# Patient Record
Sex: Female | Born: 1955 | Race: White | Hispanic: No | Marital: Married | Smoking: Current every day smoker
Health system: Southern US, Community
[De-identification: ages and names within clinical notes are randomized; demographics above are authoritative.]

## PROBLEM LIST (undated history)

## (undated) DIAGNOSIS — R079 Chest pain, unspecified: Secondary | ICD-10-CM

## (undated) DIAGNOSIS — Z720 Tobacco use: Secondary | ICD-10-CM

## (undated) DIAGNOSIS — R609 Edema, unspecified: Secondary | ICD-10-CM

## (undated) DIAGNOSIS — I2510 Atherosclerotic heart disease of native coronary artery without angina pectoris: Secondary | ICD-10-CM

## (undated) DIAGNOSIS — R0789 Other chest pain: Secondary | ICD-10-CM

## (undated) DIAGNOSIS — R7989 Other specified abnormal findings of blood chemistry: Secondary | ICD-10-CM

## (undated) DIAGNOSIS — R200 Anesthesia of skin: Secondary | ICD-10-CM

## (undated) DIAGNOSIS — E785 Hyperlipidemia, unspecified: Secondary | ICD-10-CM

## (undated) HISTORY — DX: Hyperlipidemia, unspecified: E78.5

## (undated) HISTORY — DX: Tobacco use: Z72.0

## (undated) HISTORY — PX: KNEE SURGERY: SHX244

## (undated) HISTORY — DX: Anesthesia of skin: R20.0

## (undated) HISTORY — PX: CARPAL TUNNEL RELEASE: SHX101

## (undated) HISTORY — DX: Atherosclerotic heart disease of native coronary artery without angina pectoris: I25.10

## (undated) HISTORY — DX: Chest pain, unspecified: R07.9

## (undated) HISTORY — DX: Edema, unspecified: R60.9

## (undated) HISTORY — PX: CYST REMOVAL NECK: SHX6281

## (undated) HISTORY — DX: Other specified abnormal findings of blood chemistry: R79.89

## (undated) HISTORY — DX: Other chest pain: R07.89

---

## 1998-02-10 ENCOUNTER — Other Ambulatory Visit: Admission: RE | Admit: 1998-02-10 | Discharge: 1998-02-10 | Payer: Self-pay | Admitting: Obstetrics and Gynecology

## 1998-04-21 ENCOUNTER — Encounter: Admission: RE | Admit: 1998-04-21 | Discharge: 1998-07-20 | Payer: Self-pay | Admitting: Family Medicine

## 1999-02-11 ENCOUNTER — Other Ambulatory Visit: Admission: RE | Admit: 1999-02-11 | Discharge: 1999-02-11 | Payer: Self-pay | Admitting: Obstetrics and Gynecology

## 2000-02-13 ENCOUNTER — Other Ambulatory Visit: Admission: RE | Admit: 2000-02-13 | Discharge: 2000-02-13 | Payer: Self-pay | Admitting: Obstetrics and Gynecology

## 2001-03-06 ENCOUNTER — Other Ambulatory Visit: Admission: RE | Admit: 2001-03-06 | Discharge: 2001-03-06 | Payer: Self-pay | Admitting: Obstetrics and Gynecology

## 2002-03-17 ENCOUNTER — Other Ambulatory Visit: Admission: RE | Admit: 2002-03-17 | Discharge: 2002-03-17 | Payer: Self-pay | Admitting: Obstetrics and Gynecology

## 2003-04-20 ENCOUNTER — Other Ambulatory Visit: Admission: RE | Admit: 2003-04-20 | Discharge: 2003-04-20 | Payer: Self-pay | Admitting: Obstetrics and Gynecology

## 2004-06-09 ENCOUNTER — Other Ambulatory Visit: Admission: RE | Admit: 2004-06-09 | Discharge: 2004-06-09 | Payer: Self-pay | Admitting: Obstetrics and Gynecology

## 2005-07-26 ENCOUNTER — Other Ambulatory Visit: Admission: RE | Admit: 2005-07-26 | Discharge: 2005-07-26 | Payer: Self-pay | Admitting: Obstetrics and Gynecology

## 2007-03-26 ENCOUNTER — Encounter: Admission: RE | Admit: 2007-03-26 | Discharge: 2007-03-26 | Payer: Self-pay | Admitting: Orthopedic Surgery

## 2013-02-20 ENCOUNTER — Telehealth: Payer: Self-pay | Admitting: Family Medicine

## 2013-02-20 ENCOUNTER — Encounter: Payer: Self-pay | Admitting: Physician Assistant

## 2013-02-20 ENCOUNTER — Ambulatory Visit (INDEPENDENT_AMBULATORY_CARE_PROVIDER_SITE_OTHER): Payer: BC Managed Care – PPO | Admitting: Physician Assistant

## 2013-02-20 VITALS — BP 129/80 | HR 79 | Temp 97.9°F | Ht 63.0 in | Wt 216.0 lb

## 2013-02-20 DIAGNOSIS — H9202 Otalgia, left ear: Secondary | ICD-10-CM

## 2013-02-20 DIAGNOSIS — H9209 Otalgia, unspecified ear: Secondary | ICD-10-CM

## 2013-02-20 MED ORDER — NEOMYCIN-POLYMYXIN-HC 3.5-10000-1 OT SOLN: 3.0000 [drp] | Freq: Four times a day (QID) | OTIC | Status: DC

## 2013-02-20 MED ORDER — AMOXICILLIN-POT CLAVULANATE 875-125 MG PO TABS: 1.0000 | ORAL_TABLET | Freq: Two times a day (BID) | ORAL | Status: DC

## 2013-02-20 NOTE — Patient Instructions (Signed)
Otitis Externa Otitis externa is a bacterial or fungal infection of the outer ear canal. This is the area from the eardrum to the outside of the ear. Otitis externa is sometimes called "swimmer's ear." CAUSES  Possible causes of infection include:  Swimming in dirty water.  Moisture remaining in the ear after swimming or bathing.  Mild injury (trauma) to the ear.  Objects stuck in the ear (foreign body).  Cuts or scrapes (abrasions) on the outside of the ear. SYMPTOMS  The first symptom of infection is often itching in the ear canal. Later signs and symptoms may include swelling and redness of the ear canal, ear pain, and yellowish-white fluid (pus) coming from the ear. The ear pain may be worse when pulling on the earlobe. DIAGNOSIS  Your caregiver will perform a physical exam. A sample of fluid may be taken from the ear and examined for bacteria or fungi. TREATMENT  Antibiotic ear drops are often given for 10 to 14 days. Treatment may also include pain medicine or corticosteroids to reduce itching and swelling. PREVENTION   Keep your ear dry. Use the corner of a towel to absorb water out of the ear canal after swimming or bathing.  Avoid scratching or putting objects inside your ear. This can damage the ear canal or remove the protective wax that lines the canal. This makes it easier for bacteria and fungi to grow.  Avoid swimming in lakes, polluted water, or poorly chlorinated pools.  You may use ear drops made of rubbing alcohol and vinegar after swimming. Combine equal parts of white vinegar and alcohol in a bottle. Put 3 or 4 drops into each ear after swimming. HOME CARE INSTRUCTIONS   Apply antibiotic ear drops to the ear canal as prescribed by your caregiver.  Only take over-the-counter or prescription medicines for pain, discomfort, or fever as directed by your caregiver.  If you have diabetes, follow any additional treatment instructions from your caregiver.  Keep all  follow-up appointments as directed by your caregiver. SEEK MEDICAL CARE IF:   You have a fever.  Your ear is still red, swollen, painful, or draining pus after 3 days.  Your redness, swelling, or pain gets worse.  You have a severe headache.  You have redness, swelling, pain, or tenderness in the area behind your ear. MAKE SURE YOU:   Understand these instructions.  Will watch your condition.  Will get help right away if you are not doing well or get worse. Document Released: 07/24/2005 Document Revised: 10/16/2011 Document Reviewed: 08/10/2011 Exit[employer] Patient Information 2014 [employer].  

## 2013-02-20 NOTE — Progress Notes (Signed)
Subjective:     Patient ID: Miranda Little, female   DOB: 05-Aug-1956, 57 y.o.   MRN: [id number]  HPI Pt with L ear pain Hx of otitis externa and otitis media requiring referral last yr Pain and decrease in hearing to the L ear No fever/chills No drainage from the ear  Review of Systems  All other systems reviewed and are negative.       Objective:   Physical Exam NAD L canal with erythema and edema + Fluid behind TM with loss of landmarks R ear- canal and TM no Oral- no lesions No cerv nodes    Assessment:     Otitis media/externa    Plan:     Augmentin bid x 10 days Cortisporin Otic Swimmer ears drops after swimming in future F/U prn

## 2013-02-20 NOTE — Telephone Encounter (Signed)
appt today with Miranda Little

## 2013-03-04 ENCOUNTER — Telehealth: Payer: Self-pay | Admitting: Physician Assistant

## 2013-03-04 NOTE — Telephone Encounter (Signed)
OK Diflucan 150 #1

## 2013-03-05 NOTE — Telephone Encounter (Signed)
Called in to [facility] 

## 2013-03-13 ENCOUNTER — Other Ambulatory Visit: Payer: Self-pay | Admitting: Nurse Practitioner

## 2013-03-13 ENCOUNTER — Telehealth: Payer: Self-pay | Admitting: Physician Assistant

## 2013-03-13 NOTE — Telephone Encounter (Signed)
Patient seen in office on 7-17 by Montey Hora for an ear infection. Was given an antibiotic now requesting med for yeast infection. Please advise

## 2013-03-17 MED ORDER — FLUCONAZOLE 150 MG PO TABS: 150.0000 mg | ORAL_TABLET | Freq: Once | ORAL | Status: DC

## 2013-03-17 NOTE — Telephone Encounter (Signed)
Diflucan rx sent to [facility]

## 2013-03-17 NOTE — Telephone Encounter (Signed)
Left details on vm. 

## 2013-06-04 ENCOUNTER — Encounter: Payer: Self-pay | Admitting: Family Medicine

## 2013-06-04 ENCOUNTER — Ambulatory Visit (INDEPENDENT_AMBULATORY_CARE_PROVIDER_SITE_OTHER): Payer: BC Managed Care – PPO | Admitting: Family Medicine

## 2013-06-04 VITALS — BP 130/79 | HR 87 | Temp 98.4°F | Wt 218.0 lb

## 2013-06-04 DIAGNOSIS — H9209 Otalgia, unspecified ear: Secondary | ICD-10-CM

## 2013-06-04 DIAGNOSIS — H9202 Otalgia, left ear: Secondary | ICD-10-CM

## 2013-06-04 DIAGNOSIS — J329 Chronic sinusitis, unspecified: Secondary | ICD-10-CM

## 2013-06-04 MED ORDER — AMOXICILLIN-POT CLAVULANATE 875-125 MG PO TABS: 1.0000 | ORAL_TABLET | Freq: Two times a day (BID) | ORAL | Status: DC

## 2013-06-04 NOTE — Progress Notes (Signed)
  Subjective:    Patient ID: Miranda Little, female    DOB: 1956-03-12, 57 y.o.   MRN: [id number]  HPI This 57 y.o. female presents for evaluation of right eye redness and swelling and URI  Sx's for over 2 weeks.     Review of Systems   No chest pain, SOB, HA, dizziness, vision change, N/V, diarrhea, constipation, dysuria, urinary urgency or frequency, myalgias, arthralgias or rash.  Objective:   Physical Exam  Vital signs noted  Well developed well nourished female.  HEENT - Head atraumatic Normocephalic                Eyes - PERRLA, Conjuctiva - OD injected bottom conjunctival sac with exudate.                OD conjunctiva normal. Sclera- Clear EOMI                Ears - EAC's Wnl TM's Wnl Gross Hearing WNL                Nose - Nares boggy and decreased patency                Throat - oropharanx wnl Respiratory - Lungs CTA bilateral Cardiac - RRR S1 and S2 without murmur GI - Abdomen soft Nontender and bowel sounds active x 4 Extremities - No edema. Neuro - Grossly intact.      Assessment & Plan:   Sinusitis - Plan: amoxicillin-clavulanate (AUGMENTIN) 875-125 MG per tablet po bid x 10 days  Conjunctivitis OD - Continue Tobra eye gtt's Qid x 7days.  Deatra Canter FNP

## 2013-06-04 NOTE — Patient Instructions (Signed)

## 2014-02-26 ENCOUNTER — Telehealth: Payer: Self-pay | Admitting: Family Medicine

## 2015-03-17 ENCOUNTER — Inpatient Hospital Stay (HOSPITAL_COMMUNITY)
Admission: EM | Admit: 2015-03-17 | Discharge: 2015-03-20 | DRG: 419 | Disposition: A | Discharge status: Home / Self Care | Payer: BC Managed Care – PPO | Attending: Surgery | Admitting: Surgery

## 2015-03-17 ENCOUNTER — Encounter (HOSPITAL_COMMUNITY): Payer: Self-pay | Admitting: Emergency Medicine

## 2015-03-17 ENCOUNTER — Emergency Department (HOSPITAL_COMMUNITY): Payer: BC Managed Care – PPO

## 2015-03-17 DIAGNOSIS — K8067 Calculus of gallbladder and bile duct with acute and chronic cholecystitis with obstruction: Principal | ICD-10-CM | POA: Diagnosis present

## 2015-03-17 DIAGNOSIS — F419 Anxiety disorder, unspecified: Secondary | ICD-10-CM | POA: Diagnosis present

## 2015-03-17 DIAGNOSIS — K819 Cholecystitis, unspecified: Secondary | ICD-10-CM | POA: Diagnosis present

## 2015-03-17 DIAGNOSIS — Z419 Encounter for procedure for purposes other than remedying health state, unspecified: Secondary | ICD-10-CM

## 2015-03-17 DIAGNOSIS — Z01818 Encounter for other preprocedural examination: Secondary | ICD-10-CM

## 2015-03-17 DIAGNOSIS — K8050 Calculus of bile duct without cholangitis or cholecystitis without obstruction: Secondary | ICD-10-CM

## 2015-03-17 DIAGNOSIS — F17210 Nicotine dependence, cigarettes, uncomplicated: Secondary | ICD-10-CM | POA: Diagnosis present

## 2015-03-17 DIAGNOSIS — R1011 Right upper quadrant pain: Secondary | ICD-10-CM | POA: Diagnosis present

## 2015-03-17 LAB — SURGICAL PCR SCREEN
MRSA, PCR: NEGATIVE
Staphylococcus aureus: NEGATIVE

## 2015-03-17 LAB — COMPREHENSIVE METABOLIC PANEL
ALK PHOS: 122 U/L (ref 38–126)
ALT: 430 U/L — ABNORMAL HIGH (ref 14–54)
ANION GAP: 8 (ref 5–15)
AST: 491 U/L — ABNORMAL HIGH (ref 15–41)
Albumin: 3.9 g/dL (ref 3.5–5.0)
BILIRUBIN TOTAL: 1.4 mg/dL — AB (ref 0.3–1.2)
BUN: 7 mg/dL (ref 6–20)
CHLORIDE: 105 mmol/L (ref 101–111)
CO2: 24 mmol/L (ref 22–32)
CREATININE: 0.75 mg/dL (ref 0.44–1.00)
Calcium: 9.4 mg/dL (ref 8.9–10.3)
GLUCOSE: 112 mg/dL — AB (ref 65–99)
POTASSIUM: 4.3 mmol/L (ref 3.5–5.1)
Sodium: 137 mmol/L (ref 135–145)
Total Protein: 7.2 g/dL (ref 6.5–8.1)

## 2015-03-17 LAB — URINALYSIS, ROUTINE W REFLEX MICROSCOPIC
GLUCOSE, UA: NEGATIVE mg/dL
HGB URINE DIPSTICK: NEGATIVE
KETONES UR: NEGATIVE mg/dL
LEUKOCYTES UA: NEGATIVE
Nitrite: NEGATIVE
PROTEIN: NEGATIVE mg/dL
Specific Gravity, Urine: 1.021 (ref 1.005–1.030)
UROBILINOGEN UA: 1 mg/dL (ref 0.0–1.0)
pH: 6 (ref 5.0–8.0)

## 2015-03-17 LAB — CBC WITH DIFFERENTIAL/PLATELET
BASOS ABS: 0 10*3/uL (ref 0.0–0.1)
Basophils Relative: 1 % (ref 0–1)
Eosinophils Absolute: 0 10*3/uL (ref 0.0–0.7)
Eosinophils Relative: 1 % (ref 0–5)
HEMATOCRIT: 45 % (ref 36.0–46.0)
Hemoglobin: 15.3 g/dL — ABNORMAL HIGH (ref 12.0–15.0)
LYMPHS ABS: 1 10*3/uL (ref 0.7–4.0)
LYMPHS PCT: 12 % (ref 12–46)
MCH: 29 pg (ref 26.0–34.0)
MCHC: 34 g/dL (ref 30.0–36.0)
MCV: 85.2 fL (ref 78.0–100.0)
Monocytes Absolute: 0.4 10*3/uL (ref 0.1–1.0)
Monocytes Relative: 6 % (ref 3–12)
Neutro Abs: 6.4 10*3/uL (ref 1.7–7.7)
Neutrophils Relative %: 80 % — ABNORMAL HIGH (ref 43–77)
Platelets: 267 10*3/uL (ref 150–400)
RBC: 5.28 MIL/uL — AB (ref 3.87–5.11)
RDW: 13.3 % (ref 11.5–15.5)
WBC: 7.9 10*3/uL (ref 4.0–10.5)

## 2015-03-17 LAB — LIPASE, BLOOD: LIPASE: 22 U/L (ref 22–51)

## 2015-03-17 MED ORDER — DEXTROSE 5 % IV SOLN
2.0000 g | INTRAVENOUS | Status: DC
  Administered 2015-03-18 – 2015-03-19 (×2): 2 g via INTRAVENOUS
  Filled 2015-03-17 (×3): qty 2

## 2015-03-17 MED ORDER — KCL IN DEXTROSE-NACL 20-5-0.45 MEQ/L-%-% IV SOLN
INTRAVENOUS | Status: DC
  Administered 2015-03-17: 17:00:00 via INTRAVENOUS
  Filled 2015-03-17 (×6): qty 1000

## 2015-03-17 MED ORDER — ACETAMINOPHEN 325 MG PO TABS: 650.0000 mg | ORAL_TABLET | Freq: Four times a day (QID) | ORAL | Status: DC | PRN

## 2015-03-17 MED ORDER — DIPHENHYDRAMINE HCL 12.5 MG/5ML PO ELIX: 12.5000 mg | ORAL_SOLUTION | Freq: Four times a day (QID) | ORAL | Status: DC | PRN

## 2015-03-17 MED ORDER — SIMETHICONE 80 MG PO CHEW
40.0000 mg | CHEWABLE_TABLET | Freq: Four times a day (QID) | ORAL | Status: DC | PRN
  Filled 2015-03-17: qty 1

## 2015-03-17 MED ORDER — MORPHINE SULFATE 2 MG/ML IJ SOLN: 1.0000 mg | INTRAMUSCULAR | Status: DC | PRN

## 2015-03-17 MED ORDER — ONDANSETRON 4 MG PO TBDP
4.0000 mg | ORAL_TABLET | Freq: Four times a day (QID) | ORAL | Status: DC | PRN
  Filled 2015-03-17: qty 1

## 2015-03-17 MED ORDER — LORAZEPAM 2 MG/ML IJ SOLN: 0.5000 mg | Freq: Four times a day (QID) | INTRAMUSCULAR | Status: DC | PRN

## 2015-03-17 MED ORDER — ONDANSETRON HCL 4 MG/2ML IJ SOLN
4.0000 mg | Freq: Four times a day (QID) | INTRAMUSCULAR | Status: DC | PRN
  Administered 2015-03-18: 4 mg via INTRAVENOUS

## 2015-03-17 MED ORDER — ACETAMINOPHEN 650 MG RE SUPP: 650.0000 mg | Freq: Four times a day (QID) | RECTAL | Status: DC | PRN

## 2015-03-17 MED ORDER — DIPHENHYDRAMINE HCL 50 MG/ML IJ SOLN: 12.5000 mg | Freq: Four times a day (QID) | INTRAMUSCULAR | Status: DC | PRN

## 2015-03-17 MED ORDER — DEXTROSE 5 % IV SOLN
2.0000 g | INTRAVENOUS | Status: DC
  Administered 2015-03-17: 2 g via INTRAVENOUS
  Filled 2015-03-17: qty 2

## 2015-03-17 NOTE — ED Notes (Signed)
MD at bedside. 

## 2015-03-17 NOTE — ED Notes (Signed)
Unable to obtain blood work in triage due to pt stating that "every time she gets blood drawn she passes out"

## 2015-03-17 NOTE — ED Provider Notes (Signed)
CSN: [id number]     Arrival date & time 03/17/15  4098 History   First MD Initiated Contact with Patient 03/17/15 [phone]     Chief Complaint  Patient presents with  . Abdominal Pain  . Back Pain     (Consider location/radiation/quality/duration/timing/severity/associated sxs/prior Treatment) Patient is a 59 y.o. female presenting with abdominal pain and back pain. The history is provided by the patient.  Abdominal Pain Associated symptoms: nausea   Associated symptoms: no chest pain, no diarrhea, no shortness of breath and no vomiting   Back Pain Associated symptoms: abdominal pain   Associated symptoms: no chest pain, no headaches, no numbness and no weakness   patient resides with abdominal pain. Started around 7:00 last night. She's had nausea without vomiting. She has had episodes in the past of similar pain with nausea and vomiting and the pain resolved after vomiting. States she has no fevers. Upon arrival to the ER she feels somewhat better.no diarrhea. No constipation.she's had previous colonoscopies and EGD.  History reviewed. No pertinent past medical history. Past Surgical History  Procedure Laterality Date  . Knee surgery Right   . Cyst removal neck    . Carpal tunnel release    . Cesarean section     Family History  Problem Relation Age of Onset  . Tuberculosis Mother   . Cancer Father     colon  . Stroke Father   . Hypertension Sister    Social History  Substance Use Topics  . Smoking status: Current Every Day Smoker -- 0.50 packs/day for 35 years    Types: Cigarettes  . Smokeless tobacco: Never Used  . Alcohol Use: Yes     Comment: occasionally   OB History    No data available     Review of Systems  Constitutional: Positive for appetite change. Negative for activity change.  Eyes: Negative for pain.  Respiratory: Negative for chest tightness and shortness of breath.   Cardiovascular: Negative for chest pain and leg swelling.  Gastrointestinal:  Positive for nausea and abdominal pain. Negative for vomiting and diarrhea.  Genitourinary: Negative for flank pain.  Musculoskeletal: Positive for back pain. Negative for neck stiffness.  Skin: Negative for rash.  Neurological: Negative for weakness, numbness and headaches.  Psychiatric/Behavioral: Negative for behavioral problems.      Allergies  Darvocet and Sulfa antibiotics  Home Medications   Prior to Admission medications   Medication Sig Start Date End Date Taking? Authorizing Provider  acetaminophen (TYLENOL) 325 MG tablet Take 650 mg by mouth every 6 (six) hours as needed for mild pain.   Yes Historical Provider, MD  ALPRAZolam Prudy Feeler) 1 MG tablet Take 1 mg by mouth at bedtime as needed for anxiety.   Yes Historical Provider, MD  ibuprofen (ADVIL,MOTRIN) 200 MG tablet Take 200 mg by mouth every 6 (six) hours as needed.   Yes Historical Provider, MD   BP 153/75 mmHg  Pulse 94  Temp(Src) 98.2 F (36.8 C) (Oral)  Resp 12  Ht 5\' 3"  (1.6 m)  Wt 215 lb (97.523 kg)  BMI 38.09 kg/m2  SpO2 99% Physical Exam  Constitutional: She appears well-developed.  Patient is somewhat overweight  HENT:  Head: Atraumatic.  Cardiovascular: Normal rate.   Pulmonary/Chest: Effort normal.  Abdominal: Soft. There is tenderness.  Very mild right upper quadrant tenderness without rebound or guarding.  Musculoskeletal: Normal range of motion.  Neurological: She is alert.  Skin: Skin is warm.    ED Course  Procedures (including  critical care time) Labs Review Labs Reviewed  URINALYSIS, ROUTINE W REFLEX MICROSCOPIC (NOT AT [facility]) - Abnormal; Notable for the following:    Color, Urine AMBER (*)    APPearance CLOUDY (*)    Bilirubin Urine SMALL (*)    All other components within normal limits  COMPREHENSIVE METABOLIC PANEL - Abnormal; Notable for the following:    Glucose, Bld 112 (*)    AST 491 (*)    ALT 430 (*)    Total Bilirubin 1.4 (*)    All other components within normal limits   CBC WITH DIFFERENTIAL/PLATELET - Abnormal; Notable for the following:    RBC 5.28 (*)    Hemoglobin 15.3 (*)    Neutrophils Relative % 80 (*)    All other components within normal limits  COMPREHENSIVE METABOLIC PANEL - Abnormal; Notable for the following:    Glucose, Bld 106 (*)    AST 214 (*)    ALT 354 (*)    Alkaline Phosphatase 130 (*)    All other components within normal limits  SURGICAL PCR SCREEN  LIPASE, BLOOD  CBC  SURGICAL PATHOLOGY    Imaging Review Dg Cholangiogram Operative  03/18/2015   CLINICAL DATA:  Cholelithiasis  EXAM: INTRAOPERATIVE CHOLANGIOGRAM  TECHNIQUE: Cholangiographic images from the C-arm fluoroscopic device were submitted for interpretation post-operatively. Please see the procedural report for the amount of contrast and the fluoroscopy time utilized.  COMPARISON:  03/17/2015  FLUOROSCOPY TIME:  18 seconds  FINDINGS: Injection in the cystic duct remnant reveals opacification of the intrahepatic and extrahepatic biliary tree. A a rounded filling defect is noted in the distal common bile duct with only minimal passage of contrast material into the duodenum.  IMPRESSION: Distal common bile duct stone.   Electronically Signed   By: Alcide Clever M.D.   On: 03/18/2015 08:22   US Abdomen Limited  03/17/2015   CLINICAL DATA:  Right upper quadrant pain  EXAM: US ABDOMEN LIMITED - RIGHT UPPER QUADRANT  COMPARISON:  None.  FINDINGS: Gallbladder:  Gallstones are noted within gallbladder the largest measures 3.6 cm. 1.5 cm gallstone is noted in gallbladder neck region. There is thickening of gallbladder wall up to 4.3 mm. No sonographic Murphy's sign.  Common bile duct:  Diameter: 4.4 mm in diameter within normal limits.  Liver:  No focal lesion identified. Diffuse increased echogenicity of the liver suspicious for fatty infiltration.  IMPRESSION: Gallstones are noted within gallbladder the largest measures 3.6 cm. No sonographic Murphy's sign. Thickening of gallbladder wall  up to 4.3 mm. Normal CBD. Fatty infiltration of the liver.   Electronically Signed   By: Natasha Mead M.D.   On: 03/17/2015 10:40   Dg Chest Port 1 View  03/18/2015   CLINICAL DATA:  Gallbladder surgery.  EXAM: PORTABLE CHEST - 1 VIEW  COMPARISON:  None.  FINDINGS: Mediastinum and hilar structures are normal. Cardiomegaly. No pulmonary venous congestion. Lungs are clear. No pleural effusion or pneumothorax. Degenerative changes thoracic spine.  IMPRESSION: Mild cardiomegaly. No pulmonary venous congestion. No acute pulmonary disease.   Electronically Signed   By: Maisie Fus  Register   On: 03/18/2015 07:28     EKG Interpretation   Date/Time:  Wednesday March 17 2015 15:31:29 EDT Ventricular Rate:  79 PR Interval:  179 QRS Duration: 108 QT Interval:  384 QTC Calculation: 440 R Axis:   -164 Text Interpretation:  Sinus rhythm Consider left atrial enlargement Low  voltage with right axis deviation Consider anterior infarct ED PHYSICIAN  INTERPRETATION  AVAILABLE IN [facility] Confirmed by TEST, Record  ([id number]) on 03/18/2015 7:11:24 AM      MDM   Final diagnoses:  Biliary colic  Pre-op evaluation    Patient with biliary colic. Does have some gallbladder wall thickening and large stones at the neck with mildly elevated LFTs. Seen by general surgery and will admit for cholecystectomy.    Benjiman Core, MD 03/18/15 [phone]

## 2015-03-17 NOTE — ED Notes (Signed)
P. Stated, I started having abdominal pain and bak pain last night and its so bad. I called Dr. Ewing Schlein and he said come here.

## 2015-03-17 NOTE — ED Notes (Signed)
Pt requesting no IV access unless needed at this time.

## 2015-03-17 NOTE — ED Notes (Signed)
Patient transported to Ultrasound 

## 2015-03-17 NOTE — ED Notes (Signed)
Obtained consent. 

## 2015-03-17 NOTE — H&P (Signed)
Miranda Little 23-Apr-1956  [id number].   Primary Care MD: Dr. Redge Gainer Chief Complaint/Reason for Consult: biliary colic HPI: This is a very pleasant 59 yo white female who began having abdominal pain intermittently that would hurt in her upper abdomen through to her shoulders.  This has happened on 3 separate occasions before last night.  She had a colonoscopy by Dr. Watt Climes and mentioned this to him.  He told her to call if it happened again, as she may be having biliary colic.  She ate a whopper last night and around 1900, she developed periumbilical and diffuse abdominal pain.  This was the most severe and lasted the longest of any episode.  She had horrible nausea, but did not throw up, like she did the other times.  She states that she occasionally has some discomfort after eating as well.  She presented to [facility] where she had an [place], which revealed several large stones and some gallbladder wall thickening.  Her LFTs were elevated.  The patient's pain has resolved, but we will still admit her for cholecystectomy.  ROS : Please see HPI, otherwise all other systems are currently negative except for anxiety.  Family History  Problem Relation Age of Onset  . Tuberculosis Mother   . Cancer Father     colon  . Stroke Father   . Hypertension Sister     History reviewed. No pertinent past medical history.  Past Surgical History  Procedure Laterality Date  . Knee surgery Right   . Cyst removal neck    . Carpal tunnel release    . Cesarean section      Social History:  reports that she has been smoking Cigarettes.  She has a 17.5 pack-year smoking history. She has never used smokeless tobacco. She reports that she drinks alcohol. She reports that she does not use illicit drugs.  Allergies:  Allergies  Allergen Reactions  . Darvocet [Propoxyphene N-Acetaminophen] Hives  . Sulfa Antibiotics      (Not in a hospital admission)  Blood pressure 136/79, pulse 85, temperature 97.8 F  (36.6 C), temperature source Oral, resp. rate 20, SpO2 95 %. Physical Exam: General: pleasant, obese white female who is laying in bed in NAD HEENT: head is normocephalic, atraumatic.  Sclera are noninjected.  PERRL.  Ears and nose without any masses or lesions.  Mouth is pink and moist Heart: regular, rate, and rhythm.  Normal s1,s2. No obvious murmurs, gallops, or rubs noted.  Palpable radial and pedal pulses bilaterally Lungs: CTAB, no wheezes, rhonchi, or rales noted.  Respiratory effort nonlabored Abd: soft, NT, ND, +BS, no masses, hernias, or organomegaly, obese MS: all 4 extremities are symmetrical with no cyanosis, clubbing, or edema. Skin: warm and dry with no masses, lesions, or rashes Psych: A&Ox3 with an appropriate affect.    Results for orders placed or performed during the hospital encounter of 03/17/15 (from the past 48 hour(s))  Urinalysis, Routine w reflex microscopic (not at [facility])     Status: Abnormal   Collection Time: 03/17/15  9:07 AM  Result Value Ref Range   Color, Urine AMBER (A) YELLOW    Comment: BIOCHEMICALS MAY BE AFFECTED BY COLOR   APPearance CLOUDY (A) CLEAR   Specific Gravity, Urine 1.021 1.005 - 1.030   pH 6.0 5.0 - 8.0   Glucose, UA NEGATIVE NEGATIVE mg/dL   Hgb urine dipstick NEGATIVE NEGATIVE   Bilirubin Urine SMALL (A) NEGATIVE   Ketones, ur NEGATIVE NEGATIVE mg/dL   Protein,  ur NEGATIVE NEGATIVE mg/dL   Urobilinogen, UA 1.0 0.0 - 1.0 mg/dL   Nitrite NEGATIVE NEGATIVE   Leukocytes, UA NEGATIVE NEGATIVE    Comment: MICROSCOPIC NOT DONE ON URINES WITH NEGATIVE PROTEIN, BLOOD, LEUKOCYTES, NITRITE, OR GLUCOSE <1000 mg/dL.  Comprehensive metabolic panel     Status: Abnormal   Collection Time: 03/17/15 11:00 AM  Result Value Ref Range   Sodium 137 135 - 145 mmol/L   Potassium 4.3 3.5 - 5.1 mmol/L   Chloride 105 101 - 111 mmol/L   CO2 24 22 - 32 mmol/L   Glucose, Bld 112 (H) 65 - 99 mg/dL   BUN 7 6 - 20 mg/dL   Creatinine, Ser 0.75 0.44 - 1.00  mg/dL   Calcium 9.4 8.9 - 10.3 mg/dL   Total Protein 7.2 6.5 - 8.1 g/dL   Albumin 3.9 3.5 - 5.0 g/dL   AST 491 (H) 15 - 41 U/L   ALT 430 (H) 14 - 54 U/L   Alkaline Phosphatase 122 38 - 126 U/L   Total Bilirubin 1.4 (H) 0.3 - 1.2 mg/dL   GFR calc non Af Amer >60 >60 mL/min   GFR calc Af Amer >60 >60 mL/min    Comment: (NOTE) The eGFR has been calculated using the CKD EPI equation. This calculation has not been validated in all clinical situations. eGFR's persistently <60 mL/min signify possible Chronic Kidney Disease.    Anion gap 8 5 - 15  Lipase, blood     Status: None   Collection Time: 03/17/15 11:00 AM  Result Value Ref Range   Lipase 22 22 - 51 U/L  CBC with Differential     Status: Abnormal   Collection Time: 03/17/15 11:00 AM  Result Value Ref Range   WBC 7.9 4.0 - 10.5 K/uL   RBC 5.28 (H) 3.87 - 5.11 MIL/uL   Hemoglobin 15.3 (H) 12.0 - 15.0 g/dL   HCT 45.0 36.0 - 46.0 %   MCV 85.2 78.0 - 100.0 fL   MCH 29.0 26.0 - 34.0 pg   MCHC 34.0 30.0 - 36.0 g/dL   RDW 13.3 11.5 - 15.5 %   Platelets 267 150 - 400 K/uL   Neutrophils Relative % 80 (H) 43 - 77 %   Neutro Abs 6.4 1.7 - 7.7 K/uL   Lymphocytes Relative 12 12 - 46 %   Lymphs Abs 1.0 0.7 - 4.0 K/uL   Monocytes Relative 6 3 - 12 %   Monocytes Absolute 0.4 0.1 - 1.0 K/uL   Eosinophils Relative 1 0 - 5 %   Eosinophils Absolute 0.0 0.0 - 0.7 K/uL   Basophils Relative 1 0 - 1 %   Basophils Absolute 0.0 0.0 - 0.1 K/uL   US Abdomen Limited  03/17/2015   CLINICAL DATA:  Right upper quadrant pain  EXAM: US ABDOMEN LIMITED - RIGHT UPPER QUADRANT  COMPARISON:  None.  FINDINGS: Gallbladder:  Gallstones are noted within gallbladder the largest measures 3.6 cm. 1.5 cm gallstone is noted in gallbladder neck region. There is thickening of gallbladder wall up to 4.3 mm. No sonographic Murphy's sign.  Common bile duct:  Diameter: 4.4 mm in diameter within normal limits.  Liver:  No focal lesion identified. Diffuse increased  echogenicity of the liver suspicious for fatty infiltration.  IMPRESSION: Gallstones are noted within gallbladder the largest measures 3.6 cm. No sonographic Murphy's sign. Thickening of gallbladder wall up to 4.3 mm. Normal CBD. Fatty infiltration of the liver.   Electronically Signed  By: Lahoma Crocker M.D.   On: 03/17/2015 10:40       Assessment/Plan 1. Biliary colic, early cholecystitis -admit, IVF, IV rocephin for possible early cholecystitis -plan for OR tomorrow for lap chole.  The procedure, risks, complications, and expected outcome have all been d/w the patient and her husband.  She is agreeable to proceed. -pre op EKG and CXR (smokes 0.5ppd) -IV prn ativan for anxiety -clear liquids today, NPO p MN for OR tomorrow at 0730.  Ghalia Reicks E 03/17/2015, 12:22 PM Pager: [phone]

## 2015-03-18 ENCOUNTER — Inpatient Hospital Stay (HOSPITAL_COMMUNITY): Payer: BC Managed Care – PPO | Admitting: Anesthesiology

## 2015-03-18 ENCOUNTER — Inpatient Hospital Stay (HOSPITAL_COMMUNITY): Payer: BC Managed Care – PPO

## 2015-03-18 ENCOUNTER — Encounter (HOSPITAL_COMMUNITY): Payer: Self-pay | Admitting: Anesthesiology

## 2015-03-18 ENCOUNTER — Encounter (HOSPITAL_COMMUNITY): Admission: EM | Disposition: A | Discharge status: Home / Self Care | Payer: Self-pay | Source: Home / Self Care

## 2015-03-18 HISTORY — PX: CHOLECYSTECTOMY: SHX55

## 2015-03-18 LAB — COMPREHENSIVE METABOLIC PANEL
ALT: 354 U/L — ABNORMAL HIGH (ref 14–54)
ANION GAP: 8 (ref 5–15)
AST: 214 U/L — ABNORMAL HIGH (ref 15–41)
Albumin: 3.6 g/dL (ref 3.5–5.0)
Alkaline Phosphatase: 130 U/L — ABNORMAL HIGH (ref 38–126)
BUN: 7 mg/dL (ref 6–20)
CALCIUM: 9.1 mg/dL (ref 8.9–10.3)
CO2: 27 mmol/L (ref 22–32)
CREATININE: 0.8 mg/dL (ref 0.44–1.00)
Chloride: 104 mmol/L (ref 101–111)
GFR calc Af Amer: >60 mL/min (ref >60)
GLUCOSE: 106 mg/dL — AB (ref 65–99)
Potassium: 4.2 mmol/L (ref 3.5–5.1)
Sodium: 139 mmol/L (ref 135–145)
Total Bilirubin: 1 mg/dL (ref 0.3–1.2)
Total Protein: 6.9 g/dL (ref 6.5–8.1)

## 2015-03-18 LAB — CBC
HCT: 43.3 % (ref 36.0–46.0)
Hemoglobin: 14.4 g/dL (ref 12.0–15.0)
MCH: 28.6 pg (ref 26.0–34.0)
MCHC: 33.3 g/dL (ref 30.0–36.0)
MCV: 86.1 fL (ref 78.0–100.0)
PLATELETS: 273 10*3/uL (ref 150–400)
RBC: 5.03 MIL/uL (ref 3.87–5.11)
RDW: 13.6 % (ref 11.5–15.5)
WBC: 7.5 10*3/uL (ref 4.0–10.5)

## 2015-03-18 SURGERY — LAPAROSCOPIC CHOLECYSTECTOMY WITH INTRAOPERATIVE CHOLANGIOGRAM
Anesthesia: General

## 2015-03-18 MED ORDER — FENTANYL CITRATE (PF) 250 MCG/5ML IJ SOLN
INTRAMUSCULAR | Status: AC
  Filled 2015-03-18: qty 5

## 2015-03-18 MED ORDER — MIDAZOLAM HCL 5 MG/5ML IJ SOLN
INTRAMUSCULAR | Status: DC | PRN
  Administered 2015-03-18: 2 mg via INTRAVENOUS

## 2015-03-18 MED ORDER — PROPOFOL 10 MG/ML IV BOLUS
INTRAVENOUS | Status: AC
  Filled 2015-03-18: qty 20

## 2015-03-18 MED ORDER — PHENYLEPHRINE HCL 10 MG/ML IJ SOLN
INTRAMUSCULAR | Status: DC | PRN
  Administered 2015-03-18: 120 ug via INTRAVENOUS
  Administered 2015-03-18: 80 ug via INTRAVENOUS

## 2015-03-18 MED ORDER — HYDROMORPHONE HCL 1 MG/ML IJ SOLN
0.2500 mg | INTRAMUSCULAR | Status: DC | PRN
  Administered 2015-03-18 (×2): 0.5 mg via INTRAVENOUS

## 2015-03-18 MED ORDER — BUPIVACAINE-EPINEPHRINE 0.25% -1:200000 IJ SOLN
INTRAMUSCULAR | Status: DC | PRN
  Administered 2015-03-18: 30 mL

## 2015-03-18 MED ORDER — NEOSTIGMINE METHYLSULFATE 10 MG/10ML IV SOLN
INTRAVENOUS | Status: DC | PRN
  Administered 2015-03-18: 3 mg via INTRAVENOUS

## 2015-03-18 MED ORDER — SODIUM CHLORIDE 0.9 % IJ SOLN
INTRAMUSCULAR | Status: AC
  Filled 2015-03-18: qty 10

## 2015-03-18 MED ORDER — 0.9 % SODIUM CHLORIDE (POUR BTL) OPTIME
TOPICAL | Status: DC | PRN
  Administered 2015-03-18: 1000 mL

## 2015-03-18 MED ORDER — BUPIVACAINE-EPINEPHRINE (PF) 0.25% -1:200000 IJ SOLN
INTRAMUSCULAR | Status: AC
  Filled 2015-03-18: qty 30

## 2015-03-18 MED ORDER — SODIUM CHLORIDE 0.9 % IR SOLN
Status: DC | PRN
  Administered 2015-03-18: 1000 mL

## 2015-03-18 MED ORDER — ONDANSETRON HCL 4 MG/2ML IJ SOLN
INTRAMUSCULAR | Status: AC
  Filled 2015-03-18: qty 2

## 2015-03-18 MED ORDER — MIDAZOLAM HCL 2 MG/2ML IJ SOLN
INTRAMUSCULAR | Status: AC
  Filled 2015-03-18: qty 4

## 2015-03-18 MED ORDER — SODIUM CHLORIDE 0.9 % IV SOLN
INTRAVENOUS | Status: DC | PRN
  Administered 2015-03-18: 08:00:00

## 2015-03-18 MED ORDER — EPHEDRINE SULFATE 50 MG/ML IJ SOLN
INTRAMUSCULAR | Status: AC
  Filled 2015-03-18: qty 1

## 2015-03-18 MED ORDER — PROPOFOL 10 MG/ML IV BOLUS
INTRAVENOUS | Status: DC | PRN
  Administered 2015-03-18: 200 mg via INTRAVENOUS

## 2015-03-18 MED ORDER — EPHEDRINE SULFATE 50 MG/ML IJ SOLN
INTRAMUSCULAR | Status: DC | PRN
  Administered 2015-03-18: 10 mg via INTRAVENOUS

## 2015-03-18 MED ORDER — GLYCOPYRROLATE 0.2 MG/ML IJ SOLN
INTRAMUSCULAR | Status: AC
  Filled 2015-03-18: qty 2

## 2015-03-18 MED ORDER — LACTATED RINGERS IV SOLN
INTRAVENOUS | Status: DC | PRN
  Administered 2015-03-18: 07:00:00 via INTRAVENOUS

## 2015-03-18 MED ORDER — OXYCODONE-ACETAMINOPHEN 5-325 MG PO TABS
1.0000 | ORAL_TABLET | ORAL | Status: DC | PRN
  Administered 2015-03-18 – 2015-03-19 (×3): 2 via ORAL
  Administered 2015-03-19 (×2): 1 via ORAL
  Filled 2015-03-18: qty 1
  Filled 2015-03-18: qty 2
  Filled 2015-03-18: qty 1
  Filled 2015-03-18 (×2): qty 2

## 2015-03-18 MED ORDER — MIDAZOLAM HCL 2 MG/2ML IJ SOLN: 0.5000 mg | Freq: Once | INTRAMUSCULAR | Status: DC | PRN

## 2015-03-18 MED ORDER — ROCURONIUM BROMIDE 50 MG/5ML IV SOLN
INTRAVENOUS | Status: AC
  Filled 2015-03-18: qty 1

## 2015-03-18 MED ORDER — FENTANYL CITRATE (PF) 100 MCG/2ML IJ SOLN
INTRAMUSCULAR | Status: DC | PRN
  Administered 2015-03-18 (×2): 50 ug via INTRAVENOUS
  Administered 2015-03-18: 100 ug via INTRAVENOUS
  Administered 2015-03-18: 50 ug via INTRAVENOUS

## 2015-03-18 MED ORDER — PROMETHAZINE HCL 25 MG/ML IJ SOLN
6.2500 mg | INTRAMUSCULAR | Status: DC | PRN
  Administered 2015-03-18: 6.25 mg via INTRAVENOUS

## 2015-03-18 MED ORDER — GLYCOPYRROLATE 0.2 MG/ML IJ SOLN
INTRAMUSCULAR | Status: DC | PRN
  Administered 2015-03-18: 0.4 mg via INTRAVENOUS

## 2015-03-18 MED ORDER — HYDROMORPHONE HCL 1 MG/ML IJ SOLN
INTRAMUSCULAR | Status: AC
  Filled 2015-03-18: qty 1

## 2015-03-18 MED ORDER — LIDOCAINE HCL (CARDIAC) 20 MG/ML IV SOLN
INTRAVENOUS | Status: AC
  Filled 2015-03-18: qty 5

## 2015-03-18 MED ORDER — PHENYLEPHRINE 40 MCG/ML (10ML) SYRINGE FOR IV PUSH (FOR BLOOD PRESSURE SUPPORT)
PREFILLED_SYRINGE | INTRAVENOUS | Status: AC
  Filled 2015-03-18: qty 10

## 2015-03-18 MED ORDER — LIDOCAINE HCL (CARDIAC) 20 MG/ML IV SOLN
INTRAVENOUS | Status: DC | PRN
  Administered 2015-03-18: 100 mg via INTRAVENOUS

## 2015-03-18 MED ORDER — MEPERIDINE HCL 25 MG/ML IJ SOLN: 6.2500 mg | INTRAMUSCULAR | Status: DC | PRN

## 2015-03-18 MED ORDER — PROMETHAZINE HCL 25 MG/ML IJ SOLN
INTRAMUSCULAR | Status: AC
Start: 2015-03-18 — End: 2015-03-18
  Filled 2015-03-18: qty 1

## 2015-03-18 MED ORDER — ROCURONIUM BROMIDE 100 MG/10ML IV SOLN
INTRAVENOUS | Status: DC | PRN
  Administered 2015-03-18: 40 mg via INTRAVENOUS
  Administered 2015-03-18: 5 mg via INTRAVENOUS

## 2015-03-18 SURGICAL SUPPLY — 47 items
ADH SKN CLS APL DERMABOND .7 (GAUZE/BANDAGES/DRESSINGS) ×1
APPLIER CLIP ROT 10 11.4 M/L (STAPLE) ×2
APR CLP MED LRG 11.4X10 (STAPLE) ×1
BAG SPEC RTRVL LRG 6X4 10 (ENDOMECHANICALS) ×1
BLADE SURG ROTATE 9660 (MISCELLANEOUS) ×1 IMPLANT
CANISTER SUCTION 2500CC (MISCELLANEOUS) ×2 IMPLANT
CHLORAPREP W/TINT 26ML (MISCELLANEOUS) ×2 IMPLANT
CLIP APPLIE ROT 10 11.4 M/L (STAPLE) ×1 IMPLANT
COVER MAYO STAND STRL (DRAPES) ×2 IMPLANT
COVER SURGICAL LIGHT HANDLE (MISCELLANEOUS) ×2 IMPLANT
DERMABOND ADVANCED (GAUZE/BANDAGES/DRESSINGS) ×1
DERMABOND ADVANCED .7 DNX12 (GAUZE/BANDAGES/DRESSINGS) IMPLANT
DRAPE C-ARM 42X72 X-RAY (DRAPES) ×2 IMPLANT
DRAPE WARM FLUID 44X44 (DRAPE) ×2 IMPLANT
ELECT REM PT RETURN 9FT ADLT (ELECTROSURGICAL) ×2
ELECTRODE REM PT RTRN 9FT ADLT (ELECTROSURGICAL) ×1 IMPLANT
GLOVE BIO SURGEON STRL SZ 6.5 (GLOVE) ×1 IMPLANT
GLOVE BIO SURGEON STRL SZ8 (GLOVE) ×2 IMPLANT
GLOVE BIOGEL PI IND STRL 7.0 (GLOVE) IMPLANT
GLOVE BIOGEL PI IND STRL 8 (GLOVE) ×1 IMPLANT
GLOVE BIOGEL PI INDICATOR 7.0 (GLOVE) ×3
GLOVE BIOGEL PI INDICATOR 8 (GLOVE) ×1
GLOVE BIOGEL PI ORTHO PRO SZ7 (GLOVE) ×1
GLOVE PI ORTHO PRO STRL SZ7 (GLOVE) IMPLANT
GOWN STRL REUS W/ TWL LRG LVL3 (GOWN DISPOSABLE) ×2 IMPLANT
GOWN STRL REUS W/ TWL XL LVL3 (GOWN DISPOSABLE) ×1 IMPLANT
GOWN STRL REUS W/TWL LRG LVL3 (GOWN DISPOSABLE) ×4
GOWN STRL REUS W/TWL XL LVL3 (GOWN DISPOSABLE) ×2
KIT BASIN OR (CUSTOM PROCEDURE TRAY) ×2 IMPLANT
KIT ROOM TURNOVER OR (KITS) ×2 IMPLANT
LIQUID BAND (GAUZE/BANDAGES/DRESSINGS) ×2 IMPLANT
NS IRRIG 1000ML POUR BTL (IV SOLUTION) ×2 IMPLANT
PAD ARMBOARD 7.5X6 YLW CONV (MISCELLANEOUS) ×2 IMPLANT
POUCH SPECIMEN RETRIEVAL 10MM (ENDOMECHANICALS) ×2 IMPLANT
SCISSORS LAP 5X35 DISP (ENDOMECHANICALS) ×2 IMPLANT
SET CHOLANGIOGRAPH 5 50 .035 (SET/KITS/TRAYS/PACK) ×2 IMPLANT
SET IRRIG TUBING LAPAROSCOPIC (IRRIGATION / IRRIGATOR) ×2 IMPLANT
SLEEVE ENDOPATH XCEL 5M (ENDOMECHANICALS) ×2 IMPLANT
SPECIMEN JAR SMALL (MISCELLANEOUS) ×2 IMPLANT
SUT MNCRL AB 4-0 PS2 18 (SUTURE) ×2 IMPLANT
TOWEL OR 17X24 6PK STRL BLUE (TOWEL DISPOSABLE) ×2 IMPLANT
TOWEL OR 17X26 10 PK STRL BLUE (TOWEL DISPOSABLE) ×2 IMPLANT
TRAY LAPAROSCOPIC MC (CUSTOM PROCEDURE TRAY) ×2 IMPLANT
TROCAR XCEL BLUNT TIP 100MML (ENDOMECHANICALS) ×2 IMPLANT
TROCAR XCEL NON-BLD 11X100MML (ENDOMECHANICALS) ×2 IMPLANT
TROCAR XCEL NON-BLD 5MMX100MML (ENDOMECHANICALS) ×2 IMPLANT
TUBING INSUFFLATION (TUBING) ×2 IMPLANT

## 2015-03-18 NOTE — Interval H&P Note (Signed)
History and Physical Interval Note:  03/18/2015 7:25 AM  Jamisha W [place]  has presented today for surgery, with the diagnosis of gallstones  The various methods of treatment have been discussed with the patient and family. After consideration of risks, benefits and other options for treatment, the patient has consented to  Procedure(s): LAPAROSCOPIC CHOLECYSTECTOMY WITH INTRAOPERATIVE CHOLANGIOGRAM (N/A) as a surgical intervention .  The patient's history has been reviewed, patient examined, no change in status, stable for surgery.  I have reviewed the patient's chart and labs.  Questions were answered to the patient's satisfaction.   Pt doing well this am.  Ready for surgery.  The procedure has been discussed with the patient. Operative and non operative treatments have been discussed. Risks of surgery include bleeding, infection,  Common bile duct injury,  Injury to the stomach,liver, colon,small intestine, abdominal wall,  Diaphragm,  Major blood vessels,  And the need for an open procedure.  Other risks include worsening of medical problems, death,  DVT and pulmonary embolism, and cardiovascular events.   Medical options have also been discussed. The patient has been informed of long term expectations of surgery and non surgical options,  The patient agrees to proceed.    Elsworth Ledin A.

## 2015-03-18 NOTE — Consult Note (Signed)
Subjective:   HPI  The patient is a 59 year old female status post laparoscopic cholecystectomy today. Her intraoperative cholangiogram was positive for a distal common bile duct stone. We are asked to see her in regards to ERCP.  Review of Systems No chest pain or shortness of breath   History reviewed. No pertinent past medical history. Past Surgical History  Procedure Laterality Date  . Knee surgery Right   . Cyst removal neck    . Carpal tunnel release    . Cesarean section     Social History   Social History  . Marital Status: Single    Spouse Name: N/A  . Number of Children: N/A  . Years of Education: N/A   Occupational History  . Not on file.   Social History Main Topics  . Smoking status: Current Every Day Smoker -- 0.50 packs/day for 35 years    Types: Cigarettes  . Smokeless tobacco: Never Used  . Alcohol Use: Yes     Comment: occasionally  . Drug Use: No  . Sexual Activity: Not on file   Other Topics Concern  . Not on file   Social History Narrative   family history includes Cancer in her father; Hypertension in her sister; Stroke in her father; Tuberculosis in her mother.  Current facility-administered medications:  .  acetaminophen (TYLENOL) tablet 650 mg, 650 mg, Oral, Q6H PRN **OR** acetaminophen (TYLENOL) suppository 650 mg, 650 mg, Rectal, Q6H PRN, Barnetta Chapel, PA-C .  cefTRIAXone (ROCEPHIN) 2 g in dextrose 5 % 50 mL IVPB, 2 g, Intravenous, Q24H, Md Ccs, MD, 2 g at 03/18/15 0737 .  dextrose 5 % and 0.45 % NaCl with KCl 20 mEq/L infusion, , Intravenous, Continuous, Barnetta Chapel, PA-C, Last Rate: 100 mL/hr at 03/17/15 1655 .  diphenhydrAMINE (BENADRYL) 12.5 MG/5ML elixir 12.5 mg, 12.5 mg, Oral, Q6H PRN **OR** diphenhydrAMINE (BENADRYL) injection 12.5 mg, 12.5 mg, Intravenous, Q6H PRN, Barnetta Chapel, PA-C .  HYDROmorphone (DILAUDID) 1 MG/ML injection, , , ,  .  LORazepam (ATIVAN) injection 0.5-1 mg, 0.5-1 mg, Intravenous, Q6H PRN, Barnetta Chapel,  PA-C .  morphine 2 MG/ML injection 1-4 mg, 1-4 mg, Intravenous, Q2H PRN, Barnetta Chapel, PA-C .  ondansetron (ZOFRAN-ODT) disintegrating tablet 4 mg, 4 mg, Oral, Q6H PRN **OR** ondansetron (ZOFRAN) injection 4 mg, 4 mg, Intravenous, Q6H PRN, Barnetta Chapel, PA-C, 4 mg at 03/18/15 0830 .  oxyCODONE-acetaminophen (PERCOCET/ROXICET) 5-325 MG per tablet 1-2 tablet, 1-2 tablet, Oral, Q4H PRN, Harriette Bouillon, MD .  promethazine (PHENERGAN) 25 MG/ML injection, , , ,  .  simethicone (MYLICON) chewable tablet 40 mg, 40 mg, Oral, Q6H PRN, Barnetta Chapel, PA-C Allergies  Allergen Reactions  . Darvocet [Propoxyphene N-Acetaminophen] Hives  . Sulfa Antibiotics      Objective:     BP 119/62 mmHg  Pulse 53  Temp(Src) 98.5 F (36.9 C) (Oral)  Resp 18  Ht  (1.6 m)  Wt 97.523 kg (215 lb)  BMI 38.09 kg/m2  SpO2 95%  Alert, oriented, awake from surgery  Heart regular rhythm no murmurs  Lungs clear  Abdomen some postoperative discomfort as would be expected  Laboratory No components found for: D1    Assessment:     Choledocholithiasis      Plan:     ERCP with sphincterotomy and stone extraction. The procedure was explained along with the potential risks of bleeding, infection, perforation, and pancreatitis. She understands and consents to the procedure.

## 2015-03-18 NOTE — Anesthesia Postprocedure Evaluation (Signed)
  Anesthesia Post-op Note  Patient: Miranda Little  Procedure(s) Performed: Procedure(s): LAPAROSCOPIC CHOLECYSTECTOMY WITH INTRAOPERATIVE CHOLANGIOGRAM (N/A)  Patient Location: PACU  Anesthesia Type:General  Level of Consciousness: awake, alert , oriented and patient cooperative  Airway and Oxygen Therapy: Patient Spontanous Breathing  Post-op Pain: mild  Post-op Assessment: Post-op Vital signs reviewed, Patient's Cardiovascular Status Stable, Respiratory Function Stable, Patent Airway, No signs of Nausea or vomiting and Pain level controlled              Post-op Vital Signs: Reviewed and stable  Last Vitals:  Filed Vitals:   03/18/15 1247  BP: 119/62  Pulse: 53  Temp: 36.9 C  Resp: 18    Complications: No apparent anesthesia complications

## 2015-03-18 NOTE — Anesthesia Preprocedure Evaluation (Addendum)
Anesthesia Evaluation  Patient identified by MRN, date of birth, ID band Patient awake    Reviewed: Allergy & Precautions, NPO status , Patient's Chart, lab work & pertinent test results  History of Anesthesia Complications Negative for: history of anesthetic complications  Airway Mallampati: I  TM Distance: >3 FB Neck ROM: Full    Dental  (+) Teeth Intact, Dental Advisory Given   Pulmonary Current Smoker,  breath sounds clear to auscultation        Cardiovascular - anginanegative cardio ROS  Rhythm:Regular     Neuro/Psych negative neurological ROS     GI/Hepatic Elevated LFTs Nausea with acute chole   Endo/Other  Morbid obesity  Renal/GU negative Renal ROS     Musculoskeletal   Abdominal (+) + obese,   Peds  Hematology negative hematology ROS (+)   Anesthesia Other Findings   Reproductive/Obstetrics                            Anesthesia Physical Anesthesia Plan  ASA: II  Anesthesia Plan: General   Post-op Pain Management:    Induction: Intravenous  Airway Management Planned: Oral ETT  Additional Equipment:   Intra-op Plan:   Post-operative Plan: Extubation in OR  Informed Consent: I have reviewed the patients History and Physical, chart, labs and discussed the procedure including the risks, benefits and alternatives for the proposed anesthesia with the patient or authorized representative who has indicated his/her understanding and acceptance.   Dental advisory given  Plan Discussed with: Anesthesiologist, CRNA and Surgeon  Anesthesia Plan Comments: (Plan routine monitors, GETA)       Anesthesia Quick Evaluation

## 2015-03-18 NOTE — Op Note (Signed)
Laparoscopic Cholecystectomy with IOC Procedure Note  Indications: This patient presents with symptomatic gallbladder disease and will undergo laparoscopic cholecystectomy.The procedure has been discussed with the patient. Operative and non operative treatments have been discussed. Risks of surgery include bleeding, infection,  Common bile duct injury,  Injury to the stomach,liver, colon,small intestine, abdominal wall,  Diaphragm,  Major blood vessels,  And the need for an open procedure.  Other risks include worsening of medical problems, death,  DVT and pulmonary embolism, and cardiovascular events.   Medical options have also been discussed. The patient has been informed of long term expectations of surgery and non surgical options,  The patient agrees to proceed.     Pre-operative Diagnosis: Calculus of gallbladder with acute cholecystitis, without mention of obstruction  Post-operative Diagnosis: Calculus of bile duct with acute cholecystitis without mention of obstruction  Surgeon: Clea Dubach A.   Assistants: OR staff  Anesthesia: General endotracheal anesthesia and Local anesthesia 0.25.% bupivacaine, with epinephrine  ASA Class: 2  Procedure Details  The patient was seen again in the Holding Room. The risks, benefits, complications, treatment options, and expected outcomes were discussed with the patient. The possibilities of reaction to medication, pulmonary aspiration, perforation of viscus, bleeding, recurrent infection, finding a normal gallbladder, the need for additional procedures, failure to diagnose a condition, the possible need to convert to an open procedure, and creating a complication requiring transfusion or operation were discussed with the patient. The patient and/or family concurred with the proposed plan, giving informed consent. The site of surgery properly noted/marked. The patient was taken to Operating Room, identified as Miranda Little and the procedure  verified as Laparoscopic Cholecystectomy with Intraoperative Cholangiograms. A Time Out was held and the above information confirmed.  Prior to the induction of general anesthesia, antibiotic prophylaxis was administered. General endotracheal anesthesia was then administered and tolerated well. After the induction, the abdomen was prepped in the usual sterile fashion. The patient was positioned in the supine position with the left arm comfortably tucked, along with some reverse Trendelenburg.  Local anesthetic agent was injected into the skin near the umbilicus and an incision made. The midline fascia was incised and the Hasson technique was used to introduce a 12 mm port under direct vision. It was secured with a figure of eight Vicryl suture placed in the usual fashion. Pneumoperitoneum was then created with CO2 and tolerated well without any adverse changes in the patient's vital signs. Additional trocars were introduced under direct vision with an 11 mm trocar in the epigastrium and 2 5 mm trocars in the right upper quadrant. All skin incisions were infiltrated with a local anesthetic agent before making the incision and placing the trocars.   The gallbladder was identified, the fundus grasped and retracted cephalad. Adhesions were lysed bluntly and with the electrocautery where indicated, taking care not to injure any adjacent organs or viscus. The infundibulum was grasped and retracted laterally, exposing the peritoneum overlying the triangle of Calot. This was then divided and exposed in a blunt fashion. The cystic duct was clearly identified and bluntly dissected circumferentially. The junctions of the gallbladder, cystic duct and common bile duct were clearly identified prior to the division of any linear structure.   An incision was made in the cystic duct and the cholangiogram catheter introduced. The catheter was secured using an endoclip. The study showed DISTAL CBD stones and good visualization  of the distal and proximal biliary tree.  No flow of contrast into the duodenum demonstrated. The  catheter was then removed.   The cystic duct was then  ligated with surgical clips  on the patient side and  clipped on the gallbladder side and divided. The cystic artery was identified, dissected free, ligated with clips and divided as well. Posterior cystic artery clipped and divided.  The gallbladder was dissected from the liver bed in retrograde fashion with the electrocautery. The gallbladder was removed. The liver bed was irrigated and inspected. Hemostasis was achieved with the electrocautery. Copious irrigation was utilized and was repeatedly aspirated until clear all particulate matter. Hemostasis was achieved with no signs  Of bleeding or bile leakage.  Pneumoperitoneum was completely reduced after viewing removal of the trocars under direct vision. The wound was thoroughly irrigated and the fascia was then closed with a figure of eight suture; the skin was then closed with 4 O monocryl  and a sterile dressing was applied.  Instrument, sponge, and needle counts were correct at closure and at the conclusion of the case.   Findings: Cholecystitis with Cholelithiasis AND CBD stone on IOC  Estimated Blood Loss: Minimal                 Total IV Fluids:  700 mL         Specimens: Gallbladder           Complications: None; patient tolerated the procedure well.         Disposition: PACU - hemodynamically stable.         Condition: stable

## 2015-03-18 NOTE — Transfer of Care (Signed)
Immediate Anesthesia Transfer of Care Note  Patient: Miranda Little  Procedure(s) Performed: Procedure(s): LAPAROSCOPIC CHOLECYSTECTOMY WITH INTRAOPERATIVE CHOLANGIOGRAM (N/A)  Patient Location: PACU  Anesthesia Type:General  Level of Consciousness: awake, alert  and oriented  Airway & Oxygen Therapy: Patient Spontanous Breathing and Patient connected to nasal cannula oxygen  Post-op Assessment: Report given to RN, Post -op Vital signs reviewed and stable and Patient moving all extremities X 4  Post vital signs: Reviewed and stable  Last Vitals:  Filed Vitals:   03/18/15 0504  BP: 133/84  Pulse: 87  Temp: 36.9 C  Resp: 18    Complications: No apparent anesthesia complications

## 2015-03-18 NOTE — Anesthesia Procedure Notes (Signed)
Procedure Name: Intubation Date/Time: 03/18/2015 7:31 AM Performed by: Kyung Rudd Pre-anesthesia Checklist: Patient identified, Emergency Drugs available, Suction available, Patient being monitored and Timeout performed Patient Re-evaluated:Patient Re-evaluated prior to inductionOxygen Delivery Method: Circle system utilized Preoxygenation: Pre-oxygenation with 100% oxygen Intubation Type: IV induction Ventilation: Mask ventilation without difficulty Laryngoscope Size: Mac and 3 Grade View: Grade II Tube size: 7.0 mm Number of attempts: 1 Airway Equipment and Method: Stylet and LTA kit utilized Placement Confirmation: ETT inserted through vocal cords under direct vision,  positive ETCO2 and breath sounds checked- equal and bilateral Secured at: 21 cm Tube secured with: Tape Dental Injury: Teeth and Oropharynx as per pre-operative assessment

## 2015-03-19 ENCOUNTER — Encounter (HOSPITAL_COMMUNITY): Payer: Self-pay | Admitting: *Deleted

## 2015-03-19 ENCOUNTER — Inpatient Hospital Stay (HOSPITAL_COMMUNITY): Payer: BC Managed Care – PPO | Admitting: Certified Registered Nurse Anesthetist

## 2015-03-19 ENCOUNTER — Inpatient Hospital Stay (HOSPITAL_COMMUNITY): Payer: BC Managed Care – PPO

## 2015-03-19 ENCOUNTER — Encounter (HOSPITAL_COMMUNITY): Admission: EM | Disposition: A | Discharge status: Home / Self Care | Payer: Self-pay | Source: Home / Self Care

## 2015-03-19 HISTORY — PX: ERCP: SHX5425

## 2015-03-19 SURGERY — ERCP, WITH INTERVENTION IF INDICATED
Anesthesia: General

## 2015-03-19 MED ORDER — PROMETHAZINE HCL 25 MG/ML IJ SOLN: 6.2500 mg | INTRAMUSCULAR | Status: DC | PRN

## 2015-03-19 MED ORDER — SODIUM CHLORIDE 0.9 % IV SOLN
INTRAVENOUS | Status: DC | PRN
  Administered 2015-03-19: 30 mL

## 2015-03-19 MED ORDER — MIDAZOLAM HCL 5 MG/5ML IJ SOLN
INTRAMUSCULAR | Status: DC | PRN
  Administered 2015-03-19: 2 mg via INTRAVENOUS

## 2015-03-19 MED ORDER — SUCCINYLCHOLINE CHLORIDE 20 MG/ML IJ SOLN
INTRAMUSCULAR | Status: DC | PRN
  Administered 2015-03-19: 120 mg via INTRAVENOUS

## 2015-03-19 MED ORDER — LACTATED RINGERS IV SOLN
INTRAVENOUS | Status: DC
  Administered 2015-03-19: 1000 mL via INTRAVENOUS
  Administered 2015-03-19: 14:00:00 via INTRAVENOUS

## 2015-03-19 MED ORDER — MEPERIDINE HCL 25 MG/ML IJ SOLN: 6.2500 mg | INTRAMUSCULAR | Status: DC | PRN

## 2015-03-19 MED ORDER — LIDOCAINE HCL (CARDIAC) 20 MG/ML IV SOLN
INTRAVENOUS | Status: DC | PRN
  Administered 2015-03-19: 80 mg via INTRAVENOUS

## 2015-03-19 MED ORDER — LACTATED RINGERS IV SOLN: INTRAVENOUS | Status: DC

## 2015-03-19 MED ORDER — HYDROMORPHONE HCL 1 MG/ML IJ SOLN: 0.2500 mg | INTRAMUSCULAR | Status: DC | PRN

## 2015-03-19 MED ORDER — SODIUM CHLORIDE 0.9 % IV SOLN: INTRAVENOUS | Status: DC

## 2015-03-19 MED ORDER — PROPOFOL 10 MG/ML IV BOLUS
INTRAVENOUS | Status: DC | PRN
  Administered 2015-03-19: 150 mg via INTRAVENOUS

## 2015-03-19 NOTE — Op Note (Signed)
[facility] [address] [place], [address]   ERCP PROCEDURE REPORT  PATIENT: Miranda Little, Miranda Little  MR# :[id number] BIRTHDATE: 09/04/1955  GENDER: female ENDOSCOPIST: Vida Rigger, MD REFERRED BY: PROCEDURE DATE:  03/19/2015 PROCEDURE:   ERCP with sphincterotomy/papillotomy and ERCP with removal of calculus/calculi ASA CLASS:    1 INDICATIONS: positive Intra-Op cholangiogram MEDICATIONS: Gen. anesthesia TOPICAL ANESTHETIC:  none  DESCRIPTION OF PROCEDURE:   After the risks benefits and alternatives of the procedure were thoroughly explained, informed consent was obtained.  The Pentax Ercp Scope [id number]  endoscope was introduced through the mouth and advanced to the second portion of the duodenum .a normal-appearing ampulla was brought into view and using the triple lumen sphincterotome loaded with the JAG Jagwire initially the wire was advanced two times towards the pancreasbut no dye was injected and the sphincterotome was repositioned and deep selective cannulation was obtained and the wire was advanced into the intrahepatics and we thought we saw a small stone on the first injection and we proceeded with a medium-sized sphincterotomy in the customary fashion until we had adequate biliary drainage and could get the fully bowed sphincterotome easily in and out of the duct and once we did the sphincterotomy a few tiny flecks of stone material fell out of the duct and on the first balloon pull-through a soft stone that broke up rather quickly was removed and subsequent balloon pull-throughs did not reveal any further debris or stones then we proceeded with an occlusion cholangiogram which did not reveal any further abnormalities and the balloon was pulled through 1 more time and there was some increased spasm at this point and some sluggish drainage but no obvious residual abnormality and the wire was removed and the scope was slowly withdrawn and the  patient tolerated the procedure well there was no obvious immediate complication.Estimated blood loss is zero unless otherwise noted in this procedure report.       COMPLICATIONS: none  ENDOSCOPIC IMPRESSION:1.normal ampulla 2. A few tiny CBD stones and one soft CBD stone removed as above after sphincterotomy and balloon pull-through 3. Negative occlusion cholangiogram and slightly sluggish drainage after the procedure 4. No pancreatic duct injections but 2 wires advanced towards the pancreas  RECOMMENDATIONS:customary post-ERCP orders if none will slowly advance diet and repeat labs in the a.m. and hopefully if doing well can go home tomorrow with follow-up liver tests in our office the day of surgical follow-up in our building     _______________________________ eSigned:  Vida Rigger, MD 03/19/2015 2:55 PM   CC:

## 2015-03-19 NOTE — Progress Notes (Signed)
Miranda Little 1:54 PM  Subjective: Patient seen and examined and case discussed with her and her husband and my partner Dr. Evette Cristal and her hospital computer chart was reviewed including labs and Intra-Op cholangiogram and ultrasound and op note  Objective: Vital signs stable afebrile no acute distress exam please see preassessment evaluation labs and IOC reviewed  Assessment: Positive IOC for residual CBD stone  Plan: The risks benefits methods and success rate of ERCP was discussed with the patient and her husband and will proceed today with anesthesia assistance with further workup plans and recommendations pending those findings  [facility] E  Pager [phone] After 5PM or if no answer call [phone]

## 2015-03-19 NOTE — Discharge Instructions (Signed)

## 2015-03-19 NOTE — Progress Notes (Signed)
[facility] Progress Note  1 Day Post-Op  Subjective: Pt feels good, no N/V, NPO for procedure.  Wants to ambulate OOB, but waiting for husband to come.  Some distension and incisional pain.    Objective: Vital signs in last 24 hours: Temp:  [98 F (36.7 C)-99 F (37.2 C)] 98.5 F (36.9 C) (08/12 0500) Pulse Rate:  [53-101] 83 (08/12 0500) Resp:  [12-19] 18 (08/12 0500) BP: (97-153)/(46-75) 112/61 mmHg (08/12 0500) SpO2:  [90 %-99 %] 94 % (08/12 0500) Last BM Date: 03/17/15  Intake/Output from previous day: 08/11 0701 - 08/12 0700 In: 700 [I.V.:700] Out: 50 [Blood:50] Intake/Output this shift:    PE: Gen:  Alert, NAD, pleasant Abd: Soft, mild distension and pain, +BS, no HSM, incisions C/D/I with dermabond in place   Lab Results:   Recent Labs  03/17/15 1100 03/18/15 0515  WBC 7.9 7.5  HGB 15.3* 14.4  HCT 45.0 43.3  PLT 267 273   BMET  Recent Labs  03/17/15 1100 03/18/15 0515  NA 137 139  K 4.3 4.2  CL 105 104  CO2 24 27  GLUCOSE 112* 106*  BUN 7 7  CREATININE 0.75 0.80  CALCIUM 9.4 9.1   PT/INR No results for input(s): LABPROT, INR in the last 72 hours. CMP     Component Value Date/Time   NA 139 03/18/2015 0515   K 4.2 03/18/2015 0515   CL 104 03/18/2015 0515   CO2 27 03/18/2015 0515   GLUCOSE 106* 03/18/2015 0515   BUN 7 03/18/2015 0515   CREATININE 0.80 03/18/2015 0515   CALCIUM 9.1 03/18/2015 0515   PROT 6.9 03/18/2015 0515   ALBUMIN 3.6 03/18/2015 0515   AST 214* 03/18/2015 0515   ALT 354* 03/18/2015 0515   ALKPHOS 130* 03/18/2015 0515   BILITOT 1.0 03/18/2015 0515   GFRNONAA >60 03/18/2015 0515   GFRAA >60 03/18/2015 0515   Lipase     Component Value Date/Time   LIPASE 22 03/17/2015 1100       Studies/Results: Dg Cholangiogram Operative  03/18/2015   CLINICAL DATA:  Cholelithiasis  EXAM: INTRAOPERATIVE CHOLANGIOGRAM  TECHNIQUE: Cholangiographic images from the C-arm fluoroscopic device were submitted for  interpretation post-operatively. Please see the procedural report for the amount of contrast and the fluoroscopy time utilized.  COMPARISON:  03/17/2015  FLUOROSCOPY TIME:  18 seconds  FINDINGS: Injection in the cystic duct remnant reveals opacification of the intrahepatic and extrahepatic biliary tree. A a rounded filling defect is noted in the distal common bile duct with only minimal passage of contrast material into the duodenum.  IMPRESSION: Distal common bile duct stone.   Electronically Signed   By: Alcide Clever M.D.   On: 03/18/2015 08:22   US Abdomen Limited  03/17/2015   CLINICAL DATA:  Right upper quadrant pain  EXAM: US ABDOMEN LIMITED - RIGHT UPPER QUADRANT  COMPARISON:  None.  FINDINGS: Gallbladder:  Gallstones are noted within gallbladder the largest measures 3.6 cm. 1.5 cm gallstone is noted in gallbladder neck region. There is thickening of gallbladder wall up to 4.3 mm. No sonographic Murphy's sign.  Common bile duct:  Diameter: 4.4 mm in diameter within normal limits.  Liver:  No focal lesion identified. Diffuse increased echogenicity of the liver suspicious for fatty infiltration.  IMPRESSION: Gallstones are noted within gallbladder the largest measures 3.6 cm. No sonographic Murphy's sign. Thickening of gallbladder wall up to 4.3 mm. Normal CBD. Fatty infiltration of the liver.   Electronically Signed  By: Natasha Mead M.D.   On: 03/17/2015 10:40   Dg Chest Port 1 View  03/18/2015   CLINICAL DATA:  Gallbladder surgery.  EXAM: PORTABLE CHEST - 1 VIEW  COMPARISON:  None.  FINDINGS: Mediastinum and hilar structures are normal. Cardiomegaly. No pulmonary venous congestion. Lungs are clear. No pleural effusion or pneumothorax. Degenerative changes thoracic spine.  IMPRESSION: Mild cardiomegaly. No pulmonary venous congestion. No acute pulmonary disease.   Electronically Signed   By: Maisie Fus  Register   On: 03/18/2015 07:28    Anti-infectives: Anti-infectives    Start     Dose/Rate Route  Frequency Ordered Stop   03/18/15 1000  cefTRIAXone (ROCEPHIN) 2 g in dextrose 5 % 50 mL IVPB     2 g 100 mL/hr over 30 Minutes Intravenous Every 24 hours 03/17/15 1819     03/17/15 1515  cefTRIAXone (ROCEPHIN) 2 g in dextrose 5 % 50 mL IVPB  Status:  Discontinued     2 g 100 mL/hr over 30 Minutes Intravenous Every 24 hours 03/17/15 1508 03/17/15 1818       Assessment/Plan Cholecystitis with cholelithiasis with obstruction Choledocholithiasis found on IOC -IVF, IV rocephin, pain control, antiemetics -NPO for ERCP today for stone removal -Ambulate and IS -Hopefully home tomorrow if ERCP goes well today    LOS: 2 days    Nonie Hoyer 03/19/2015, 8:31 AM Pager: [phone]

## 2015-03-19 NOTE — Anesthesia Procedure Notes (Signed)
Procedure Name: Intubation Date/Time: 03/19/2015 2:13 PM Performed by: [place] Pink Pre-anesthesia Checklist: Patient identified, Emergency Drugs available, Suction available, Patient being monitored and Timeout performed Patient Re-evaluated:Patient Re-evaluated prior to inductionOxygen Delivery Method: Circle system utilized Preoxygenation: Pre-oxygenation with 100% oxygen Intubation Type: IV induction and Rapid sequence Laryngoscope Size: Mac and 3 Grade View: Grade II Tube type: Oral Tube size: 7.0 mm Number of attempts: 1 Airway Equipment and Method: Stylet and LTA kit utilized Placement Confirmation: ETT inserted through vocal cords under direct vision,  positive ETCO2 and breath sounds checked- equal and bilateral Secured at: 22 cm Tube secured with: Tape Dental Injury: Teeth and Oropharynx as per pre-operative assessment

## 2015-03-19 NOTE — Anesthesia Preprocedure Evaluation (Addendum)
Anesthesia Evaluation  Patient identified by MRN, date of birth, ID band Patient awake    Reviewed: Allergy & Precautions, NPO status , Patient's Chart, lab work & pertinent test results  Airway Mallampati: II       Dental  (+) Teeth Intact   Pulmonary neg pulmonary ROS, Current Smoker,  breath sounds clear to auscultation        Cardiovascular Exercise Tolerance: Good Rhythm:Regular Rate:Normal     Neuro/Psych negative neurological ROS  negative psych ROS   GI/Hepatic negative GI ROS, Neg liver ROS,   Endo/Other  negative endocrine ROS  Renal/GU negative Renal ROS  negative genitourinary   Musculoskeletal negative musculoskeletal ROS (+)   Abdominal   Peds negative pediatric ROS (+)  Hematology negative hematology ROS (+)   Anesthesia Other Findings   Reproductive/Obstetrics negative OB ROS                            Anesthesia Physical Anesthesia Plan  ASA: II  Anesthesia Plan: General   Post-op Pain Management:    Induction: Intravenous  Airway Management Planned: Oral ETT  Additional Equipment:   Intra-op Plan:   Post-operative Plan: Extubation in OR  Informed Consent: I have reviewed the patients History and Physical, chart, labs and discussed the procedure including the risks, benefits and alternatives for the proposed anesthesia with the patient or authorized representative who has indicated his/her understanding and acceptance.   Dental advisory given  Plan Discussed with:   Anesthesia Plan Comments:         Anesthesia Quick Evaluation

## 2015-03-19 NOTE — Transfer of Care (Signed)
Immediate Anesthesia Transfer of Care Note  Patient: Miranda Little  Procedure(s) Performed: Procedure(s): ENDOSCOPIC RETROGRADE CHOLANGIOPANCREATOGRAPHY (ERCP) (N/A)  Patient Location: Endoscopy Unit  Anesthesia Type:General  Level of Consciousness: awake, alert  and oriented  Airway & Oxygen Therapy: Patient Spontanous Breathing and Patient connected to nasal cannula oxygen  Post-op Assessment: Report given to RN and Post -op Vital signs reviewed and stable  Post vital signs: Reviewed and stable  Last Vitals:  Filed Vitals:   03/19/15 1459  BP: 145/74  Pulse: 96  Temp: 37.1 C  Resp: 23    Complications: No apparent anesthesia complications

## 2015-03-20 LAB — COMPREHENSIVE METABOLIC PANEL
ALK PHOS: 122 U/L (ref 38–126)
ALT: 221 U/L — ABNORMAL HIGH (ref 14–54)
AST: 95 U/L — ABNORMAL HIGH (ref 15–41)
Albumin: 3.4 g/dL — ABNORMAL LOW (ref 3.5–5.0)
Anion gap: 9 (ref 5–15)
CALCIUM: 9 mg/dL (ref 8.9–10.3)
CO2: 26 mmol/L (ref 22–32)
Chloride: 101 mmol/L (ref 101–111)
Creatinine, Ser: 0.79 mg/dL (ref 0.44–1.00)
GFR calc Af Amer: >60 mL/min (ref >60)
Glucose, Bld: 92 mg/dL (ref 65–99)
Potassium: 3.7 mmol/L (ref 3.5–5.1)
SODIUM: 136 mmol/L (ref 135–145)
Total Bilirubin: 1.1 mg/dL (ref 0.3–1.2)
Total Protein: 6.9 g/dL (ref 6.5–8.1)

## 2015-03-20 LAB — CBC WITH DIFFERENTIAL/PLATELET
Basophils Absolute: 0 10*3/uL (ref 0.0–0.1)
Basophils Relative: 0 % (ref 0–1)
EOS ABS: 0.2 10*3/uL (ref 0.0–0.7)
Eosinophils Relative: 3 % (ref 0–5)
HCT: 39.6 % (ref 36.0–46.0)
HEMOGLOBIN: 12.9 g/dL (ref 12.0–15.0)
Lymphocytes Relative: 21 % (ref 12–46)
Lymphs Abs: 1.7 10*3/uL (ref 0.7–4.0)
MCH: 28.1 pg (ref 26.0–34.0)
MCHC: 32.6 g/dL (ref 30.0–36.0)
MCV: 86.3 fL (ref 78.0–100.0)
Monocytes Absolute: 0.8 10*3/uL (ref 0.1–1.0)
Monocytes Relative: 10 % (ref 3–12)
NEUTROS PCT: 66 % (ref 43–77)
Neutro Abs: 5.2 10*3/uL (ref 1.7–7.7)
PLATELETS: 272 10*3/uL (ref 150–400)
RBC: 4.59 MIL/uL (ref 3.87–5.11)
RDW: 13.5 % (ref 11.5–15.5)
WBC: 8 10*3/uL (ref 4.0–10.5)

## 2015-03-20 MED ORDER — OXYCODONE-ACETAMINOPHEN 5-325 MG PO TABS: 1.0000 | ORAL_TABLET | ORAL | Status: DC | PRN

## 2015-03-20 NOTE — Progress Notes (Signed)
D/c to home via wheelchair. VSS breathing regular and unlabored and no nausea and vomiting after eating regular breakfast. Instructions and Rx's given and explained with understanding

## 2015-03-20 NOTE — Discharge Summary (Signed)
Patient ID: Miranda Little MRN: [id number] DOB/AGE: September 21, 1955 59 y.o.  Admit date: 03/17/2015 Discharge date: 03/20/2015  Procedures: lap chole with + IOC ERCP  Consults: GI  Reason for Admission: This is a very pleasant 58 yo white female who began having abdominal pain intermittently that would hurt in her upper abdomen through to her shoulders. This has happened on 3 separate occasions before last night. She had a colonoscopy by Dr. Ewing Schlein and mentioned this to him. He told her to call if it happened again, as she may be having biliary colic. She ate a whopper last night and around 1900, she developed periumbilical and diffuse abdominal pain. This was the most severe and lasted the longest of any episode. She had horrible nausea, but did not throw up, like she did the other times. She states that she occasionally has some discomfort after eating as well. She presented to [facility] where she had an [place], which revealed several large stones and some gallbladder wall thickening. Her LFTs were elevated. The patient's pain has resolved, but we will still admit her for cholecystectomy.  Admission Diagnoses:  1. Early acute cholecystitis  Hospital Course: The patient was admitted and underwent a lap chole with a + IOC.  GI was consulted and then underwent ERCP for ductal clearing. The patient tolerated both procedures well.  On POD 2, the patient was doing well and stable for dc home.  PE: Abd: soft, minimally tender, +BS, incisions c/d/i  Discharge Diagnoses:  Principal Problem:   Cholecystitis choledocholithiasis S/p lap chole with + IOC S/p ERCP  Discharge Medications:   Medication List    TAKE these medications        acetaminophen 325 MG tablet  Commonly known as:  TYLENOL  Take 650 mg by mouth every 6 (six) hours as needed for mild pain.     ALPRAZolam 1 MG tablet  Commonly known as:  XANAX  Take 1 mg by mouth at bedtime as needed for anxiety.     ibuprofen 200 MG tablet   Commonly known as:  ADVIL,MOTRIN  Take 200 mg by mouth every 6 (six) hours as needed.     oxyCODONE-acetaminophen 5-325 MG per tablet  Commonly known as:  PERCOCET/ROXICET  Take 1-2 tablets by mouth every 4 (four) hours as needed for moderate pain.        Discharge Instructions:     Follow-up Information    Follow up with [facility] [place]. Go on 04/06/2015.   Why:  For post-operation check. Your appointment is at 1:45pm, please arrive at least 30 min before your appointment to complete your check in paperwork.  If you are unable to arrive 30 min prior to your appointment time we may have to cancel or reschedule you   Contact information:   [address] [place] [id number] [phone]      Signed: Letha Cape 03/20/2015, 10:10 AM

## 2015-03-20 NOTE — Progress Notes (Signed)
Patient ID: Miranda Little, female   DOB: [birth year], 59 y.o.   MRN: [id number] [facility] Progress Note  Miranda Little 59 y.o. 01-16-1956   Subjective: Feels good. Passed a lot of gas overnight, no BMs. Denies abdominal pain, N/V. Sitting in room.  Objective: Vital signs: Filed Vitals:   03/20/15 0510  BP: 129/71  Pulse: 96  Temp: 98.4 F (36.9 C)  Resp: 18    Physical Exam: Gen: alert, no acute distress, obese, pleasant CV: RRR Chest: CTA B Abd: epigastric and RUQ tenderness with minimal guarding, soft, nondistended, +BS, obese Skin: no rash or jaundice  Lab Results:  Recent Labs  03/18/15 0515 03/20/15 0402  NA 139 136  K 4.2 3.7  CL 104 101  CO2 27 26  GLUCOSE 106* 92  BUN 7 <5*  CREATININE 0.80 0.79  CALCIUM 9.1 9.0    Recent Labs  03/18/15 0515 03/20/15 0402  AST 214* 95*  ALT 354* 221*  ALKPHOS 130* 122  BILITOT 1.0 1.1  PROT 6.9 6.9  ALBUMIN 3.6 3.4*    Recent Labs  03/17/15 1100 03/18/15 0515 03/20/15 0402  WBC 7.9 7.5 8.0  NEUTROABS 6.4  --  5.2  HGB 15.3* 14.4 12.9  HCT 45.0 43.3 39.6  MCV 85.2 86.1 86.3  PLT 267 273 272      Assessment/Plan: S/P lap chole on 03/18/15 and s/p ERCP with CBD stone extraction and sphincterotomy on 03/19/15 with improving LFTs. Seems to be improving clinically. Advance diet per surgery. Will sign off. Call if questions.   Harjot Zavadil C. 03/20/2015, 8:57 AM  Pager [phone]  If no answer or after 5 PM call [phone]

## 2015-03-22 ENCOUNTER — Encounter (HOSPITAL_COMMUNITY): Payer: Self-pay | Admitting: Gastroenterology

## 2015-03-22 NOTE — Anesthesia Postprocedure Evaluation (Signed)
  Anesthesia Post-op Note  Patient: Miranda Little  Procedure(s) Performed: Procedure(s): ENDOSCOPIC RETROGRADE CHOLANGIOPANCREATOGRAPHY (ERCP) (N/A)  Patient Location: PACU  Anesthesia Type:General  Level of Consciousness: awake and alert   Airway and Oxygen Therapy: Patient Spontanous Breathing  Post-op Pain: none  Post-op Assessment: Post-op Vital signs reviewed              Post-op Vital Signs: Reviewed and stable  Last Vitals:  Filed Vitals:   03/20/15 1125  BP: 125/74  Pulse: 77  Temp: 37 C  Resp: 18    Complications: No apparent anesthesia complications

## 2016-01-31 ENCOUNTER — Ambulatory Visit (INDEPENDENT_AMBULATORY_CARE_PROVIDER_SITE_OTHER): Payer: BC Managed Care – PPO | Admitting: Family Medicine

## 2016-01-31 ENCOUNTER — Encounter: Payer: Self-pay | Admitting: Family Medicine

## 2016-01-31 VITALS — BP 133/82 | HR 100 | Temp 97.6°F | Ht 63.0 in | Wt 219.2 lb

## 2016-01-31 DIAGNOSIS — Z720 Tobacco use: Secondary | ICD-10-CM | POA: Diagnosis not present

## 2016-01-31 DIAGNOSIS — H60391 Other infective otitis externa, right ear: Secondary | ICD-10-CM | POA: Diagnosis not present

## 2016-01-31 DIAGNOSIS — B3731 Acute candidiasis of vulva and vagina: Secondary | ICD-10-CM

## 2016-01-31 DIAGNOSIS — B373 Candidiasis of vulva and vagina: Secondary | ICD-10-CM | POA: Diagnosis not present

## 2016-01-31 MED ORDER — CIPROFLOXACIN-DEXAMETHASONE 0.3-0.1 % OT SUSP: 4.0000 [drp] | Freq: Two times a day (BID) | OTIC | Status: DC

## 2016-01-31 MED ORDER — FLUCONAZOLE 150 MG PO TABS: 150.0000 mg | ORAL_TABLET | Freq: Once | ORAL | Status: DC

## 2016-01-31 NOTE — Patient Instructions (Signed)
Great to meet you!  I have sent ciprodex to your pharmacy, use it for 5-7 days  Otitis Externa Otitis externa is a bacterial or fungal infection of the outer ear canal. This is the area from the eardrum to the outside of the ear. Otitis externa is sometimes called "swimmer's ear." CAUSES  Possible causes of infection include:  Swimming in dirty water.  Moisture remaining in the ear after swimming or bathing.  Mild injury (trauma) to the ear.  Objects stuck in the ear (foreign body).  Cuts or scrapes (abrasions) on the outside of the ear. SIGNS AND SYMPTOMS  The first symptom of infection is often itching in the ear canal. Later signs and symptoms may include swelling and redness of the ear canal, ear pain, and yellowish-white fluid (pus) coming from the ear. The ear pain may be worse when pulling on the earlobe. DIAGNOSIS  Your health care provider will perform a physical exam. A sample of fluid may be taken from the ear and examined for bacteria or fungi. TREATMENT  Antibiotic ear drops are often given for 10 to 14 days. Treatment may also include pain medicine or corticosteroids to reduce itching and swelling. HOME CARE INSTRUCTIONS   Apply antibiotic ear drops to the ear canal as prescribed by your health care provider.  Take medicines only as directed by your health care provider.  If you have diabetes, follow any additional treatment instructions from your health care provider.  Keep all follow-up visits as directed by your health care provider. PREVENTION   Keep your ear dry. Use the corner of a towel to absorb water out of the ear canal after swimming or bathing.  Avoid scratching or putting objects inside your ear. This can damage the ear canal or remove the protective wax that lines the canal. This makes it easier for bacteria and fungi to grow.  Avoid swimming in lakes, polluted water, or poorly chlorinated pools.  You may use ear drops made of rubbing alcohol and  vinegar after swimming. Combine equal parts of white vinegar and alcohol in a bottle. Put 3 or 4 drops into each ear after swimming. SEEK MEDICAL CARE IF:   You have a fever.  Your ear is still red, swollen, painful, or draining pus after 3 days.  Your redness, swelling, or pain gets worse.  You have a severe headache.  You have redness, swelling, pain, or tenderness in the area behind your ear. MAKE SURE YOU:   Understand these instructions.  Will watch your condition.  Will get help right away if you are not doing well or get worse.   This information is not intended to replace advice given to you by your health care provider. Make sure you discuss any questions you have with your health care provider.   Document Released: 07/24/2005 Document Revised: 08/14/2014 Document Reviewed: 08/10/2011 Elsevier Interactive Patient Education [employer]vier Inc.

## 2016-01-31 NOTE — Progress Notes (Signed)
   HPI  Patient presents today here for ear pain.  Patient slight she's had ear pain off and on for about 2 weeks, she did take 7 days of Augmentin whenever it started. She developed a yeast infection after that which is still lingering.  [place] and has had onset of mild dizziness with some room spinning and some uneasy feelings, she describes the feelings is very mild.  She denies any difficulty tolerating food or fluid. She denies fever, chills, sweats. She has mild cough, she's a smoker and is not ready to quit.   PMH: Smoking status noted ROS: Per HPI  Objective: BP 133/82 mmHg  Pulse 100  Temp(Src) 97.6 F (36.4 C) (Oral)  Ht 5\' 3"  (1.6 m)  Wt 219 lb 3.2 oz (99.428 kg)  BMI 38.84 kg/m2 Gen: NAD, alert, cooperative with exam HEENT: NCAT, TMs normal bilaterally, mild erythema of the external ear canal the right side, CV: RRR, good S1/S2, no murmur Resp: CTABL, no wheezes, non-labored Ext: No edema, warm Neuro: Alert and oriented, No gross deficits  Assessment and plan:  # Otitis externa Treat with Ciprodex Discussed maneuvers to open eustachian tube to ensure no fluid is building up, has onset of mild dizziness after coming back from the mountains, no fluid seen on exam  # Yeast infection After Augmentin course Diflucan 2  # Tobacco abuse Recommended cessation, she is not ready  We reviewed her healthcare maintenance, she gets mammograms, DEXA scans, and lab work with her GYN every year.     Meds ordered this encounter  Medications  . ciprofloxacin-dexamethasone (CIPRODEX) otic suspension    Sig: Place 4 drops into the right ear 2 (two) times daily.    Dispense:  7.5 mL    Refill:  0  . fluconazole (DIFLUCAN) 150 MG tablet    Sig: Take 1 tablet (150 mg total) by mouth once. Repeat in 3 days    Dispense:  2 tablet    Refill:  0    Murtis SinkSam Ishaaq Penna, MD [facility] 01/31/2016, 9:48 AM

## 2016-02-07 ENCOUNTER — Telehealth: Payer: Self-pay | Admitting: Pediatrics

## 2016-02-22 ENCOUNTER — Telehealth: Payer: Self-pay | Admitting: Pediatrics

## 2016-03-07 NOTE — Telephone Encounter (Signed)
[facility] at [facility]

## 2017-01-16 DIAGNOSIS — H2513 Age-related nuclear cataract, bilateral: Secondary | ICD-10-CM | POA: Diagnosis not present

## 2017-01-16 DIAGNOSIS — H04123 Dry eye syndrome of bilateral lacrimal glands: Secondary | ICD-10-CM | POA: Diagnosis not present

## 2017-03-05 IMAGING — DX DG CHEST 1V PORT
1 series · 1 of 1 positions shown · non-contrast
Comparison: None.

CLINICAL DATA: Gallbladder surgery.

EXAM:
PORTABLE CHEST - 1 VIEW

[chest ap]
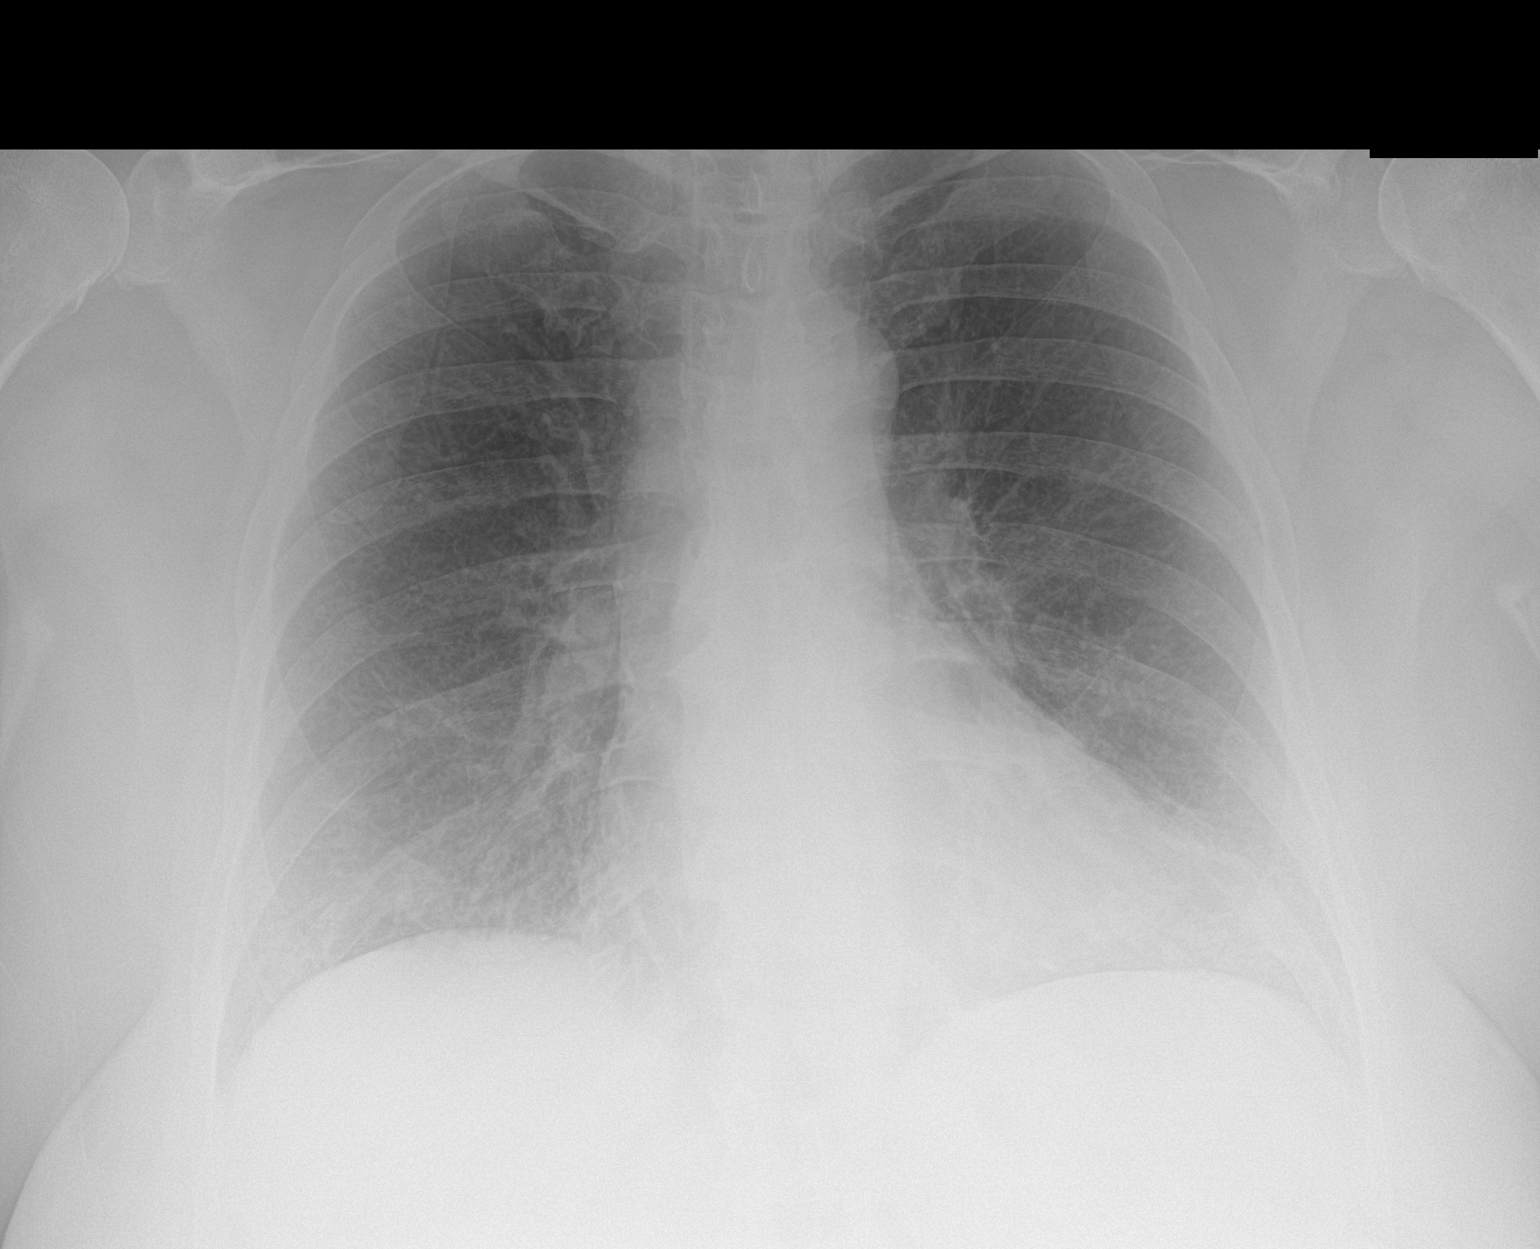

[1 of 1 positions shown; findings below may reference images not displayed]

FINDINGS: Mediastinum and hilar structures are normal. Cardiomegaly. No
pulmonary venous congestion. Lungs are clear. No pleural effusion or
pneumothorax. Degenerative changes thoracic spine.
IMPRESSION: Mild cardiomegaly. No pulmonary venous congestion. No acute
pulmonary disease.

## 2017-06-30 ENCOUNTER — Ambulatory Visit (INDEPENDENT_AMBULATORY_CARE_PROVIDER_SITE_OTHER): Payer: BC Managed Care – PPO | Admitting: Pediatrics

## 2017-06-30 ENCOUNTER — Encounter: Payer: Self-pay | Admitting: Pediatrics

## 2017-06-30 VITALS — BP 130/81 | HR 98 | Temp 97.8°F | Ht 63.0 in | Wt 228.6 lb

## 2017-06-30 DIAGNOSIS — B379 Candidiasis, unspecified: Secondary | ICD-10-CM | POA: Diagnosis not present

## 2017-06-30 DIAGNOSIS — L03213 Periorbital cellulitis: Secondary | ICD-10-CM | POA: Diagnosis not present

## 2017-06-30 MED ORDER — FLUCONAZOLE 150 MG PO TABS: 150.0000 mg | ORAL_TABLET | ORAL | 0 refills | Status: DC | PRN

## 2017-06-30 MED ORDER — AMOXICILLIN 875 MG PO TABS: 875.0000 mg | ORAL_TABLET | Freq: Two times a day (BID) | ORAL | 0 refills | Status: DC

## 2017-06-30 MED ORDER — CLINDAMYCIN HCL 300 MG PO CAPS: 300.0000 mg | ORAL_CAPSULE | Freq: Three times a day (TID) | ORAL | 0 refills | Status: DC

## 2017-06-30 NOTE — Progress Notes (Signed)
  Subjective:   Patient ID: Miranda Little, female    DOB: 07/19/1956, 61 y.o.   MRN: [id number] CC: Eye Pain and Facial Swelling  HPI: Miranda Little is a [age] y.o. female presenting for Eye Pain and Facial Swelling  Left eye started itching 2 days ago she was rubbing it a lot The next day started having redness L lower lid, having a bump Felt similar to previous stye Used warm compresses throughout the day This morning when she woke up had a very tender on the left lower lid, much more tender than previous styes have been   No URI symptoms No redness to the whites of her eyes No fevers Feeling well in general [facility] No pain with eye movments No fevers   Relevant past medical, surgical, family and social history reviewed. Allergies and medications reviewed and updated. Social History   Tobacco Use  Smoking Status Current Every Day Smoker  . Packs/day: 0.50  . Years: 35.00  . Pack years: 17.50  . Types: Cigarettes  Smokeless Tobacco Never Used   ROS: Per HPI   Objective:    BP 130/81   Pulse 98   Temp 97.8 F (36.6 C) (Oral)   Ht 5\' 3"  (1.6 m)   Wt 228 lb 9.6 oz (103.7 kg)   BMI 40.49 kg/m   Wt Readings from Last 3 Encounters:  06/30/17 228 lb 9.6 oz (103.7 kg)  01/31/16 219 lb 3.2 oz (99.4 kg)  03/17/15 215 lb (97.5 kg)    Gen: NAD, alert, cooperative with exam, NCAT EYES: EOMI without pain, no conjunctival injection, PERRL, or no icterus, L lower lid with redness and swelling, 1mm white nodule on the inner left lower lid ENT:  TMs pearly gray b/l, OP without erythema LYMPH: no cervical LAD CV: NRRR, normal S1/S2, no murmur, distal pulses 2+ b/l Resp: slight end exp wheeze L upper lobe, normal WOB Ext: No edema, warm Neuro: Alert and oriented  Assessment & Plan:  Nicholas was seen today for eye pain and facial swelling.  Diagnoses and all orders for this visit:  Preseptal cellulitis of left lower  eyelid Start below Warm compresses at the day Any worsening needs to be seen, return cautions discussed If not improving needs to be seen by eye doctor -     amoxicillin (AMOXIL) 875 MG tablet; Take 1 tablet (875 mg total) by mouth 2 (two) times daily. -     clindamycin (CLEOCIN) 300 MG capsule; Take 1 capsule (300 mg total) by mouth 3 (three) times daily.  Yeast infection -     fluconazole (DIFLUCAN) 150 MG tablet; Take 1 tablet (150 mg total) by mouth every three (3) days as needed.   Follow up plan: Return if symptoms worsen or fail to improve. Rex Krasarol Vincent, MD [facility]

## 2017-06-30 NOTE — Patient Instructions (Signed)
Use warm compresses throughout the day Take both antibiotics as prescribed

## 2017-11-12 DIAGNOSIS — Z01419 Encounter for gynecological examination (general) (routine) without abnormal findings: Secondary | ICD-10-CM | POA: Diagnosis not present

## 2018-01-10 DIAGNOSIS — Z1329 Encounter for screening for other suspected endocrine disorder: Secondary | ICD-10-CM | POA: Diagnosis not present

## 2018-01-10 DIAGNOSIS — Z13228 Encounter for screening for other metabolic disorders: Secondary | ICD-10-CM | POA: Diagnosis not present

## 2018-01-10 DIAGNOSIS — Z13220 Encounter for screening for lipoid disorders: Secondary | ICD-10-CM | POA: Diagnosis not present

## 2018-02-01 ENCOUNTER — Telehealth: Payer: Self-pay | Admitting: *Deleted

## 2018-02-01 NOTE — Telephone Encounter (Signed)
Left message for patient to call back to schedule a physical and to go over lab results

## 2021-09-06 ENCOUNTER — Other Ambulatory Visit (HOSPITAL_COMMUNITY): Payer: Medicare PPO

## 2021-09-06 ENCOUNTER — Other Ambulatory Visit: Payer: Self-pay

## 2021-09-06 ENCOUNTER — Inpatient Hospital Stay (HOSPITAL_COMMUNITY)
Admission: EM | Admit: 2021-09-06 | Discharge: 2021-09-07 | DRG: 247 | Disposition: A | Discharge status: Home / Self Care | Payer: 59 | Attending: Cardiology | Admitting: Cardiology

## 2021-09-06 ENCOUNTER — Emergency Department (HOSPITAL_COMMUNITY): Payer: 59

## 2021-09-06 ENCOUNTER — Encounter (HOSPITAL_COMMUNITY): Payer: Self-pay | Admitting: Emergency Medicine

## 2021-09-06 ENCOUNTER — Inpatient Hospital Stay (HOSPITAL_COMMUNITY)
Admission: EM | Disposition: A | Discharge status: Home / Self Care | Payer: Self-pay | Source: Home / Self Care | Attending: Internal Medicine

## 2021-09-06 DIAGNOSIS — Z20822 Contact with and (suspected) exposure to covid-19: Secondary | ICD-10-CM | POA: Diagnosis present

## 2021-09-06 DIAGNOSIS — R079 Chest pain, unspecified: Secondary | ICD-10-CM | POA: Diagnosis not present

## 2021-09-06 DIAGNOSIS — Z882 Allergy status to sulfonamides status: Secondary | ICD-10-CM

## 2021-09-06 DIAGNOSIS — I959 Hypotension, unspecified: Secondary | ICD-10-CM | POA: Diagnosis present

## 2021-09-06 DIAGNOSIS — Z823 Family history of stroke: Secondary | ICD-10-CM | POA: Diagnosis not present

## 2021-09-06 DIAGNOSIS — I509 Heart failure, unspecified: Secondary | ICD-10-CM | POA: Diagnosis present

## 2021-09-06 DIAGNOSIS — I10 Essential (primary) hypertension: Secondary | ICD-10-CM

## 2021-09-06 DIAGNOSIS — I213 ST elevation (STEMI) myocardial infarction of unspecified site: Principal | ICD-10-CM | POA: Diagnosis present

## 2021-09-06 DIAGNOSIS — E669 Obesity, unspecified: Secondary | ICD-10-CM | POA: Diagnosis present

## 2021-09-06 DIAGNOSIS — Z720 Tobacco use: Secondary | ICD-10-CM

## 2021-09-06 DIAGNOSIS — I25110 Atherosclerotic heart disease of native coronary artery with unstable angina pectoris: Secondary | ICD-10-CM | POA: Diagnosis present

## 2021-09-06 DIAGNOSIS — F17210 Nicotine dependence, cigarettes, uncomplicated: Secondary | ICD-10-CM | POA: Diagnosis present

## 2021-09-06 DIAGNOSIS — I2510 Atherosclerotic heart disease of native coronary artery without angina pectoris: Secondary | ICD-10-CM

## 2021-09-06 DIAGNOSIS — E7800 Pure hypercholesterolemia, unspecified: Secondary | ICD-10-CM | POA: Diagnosis present

## 2021-09-06 DIAGNOSIS — Z8249 Family history of ischemic heart disease and other diseases of the circulatory system: Secondary | ICD-10-CM

## 2021-09-06 DIAGNOSIS — Z79899 Other long term (current) drug therapy: Secondary | ICD-10-CM | POA: Diagnosis not present

## 2021-09-06 DIAGNOSIS — Z885 Allergy status to narcotic agent status: Secondary | ICD-10-CM

## 2021-09-06 DIAGNOSIS — I110 Hypertensive heart disease with heart failure: Secondary | ICD-10-CM | POA: Diagnosis present

## 2021-09-06 DIAGNOSIS — Z955 Presence of coronary angioplasty implant and graft: Secondary | ICD-10-CM

## 2021-09-06 DIAGNOSIS — I214 Non-ST elevation (NSTEMI) myocardial infarction: Secondary | ICD-10-CM

## 2021-09-06 DIAGNOSIS — Z809 Family history of malignant neoplasm, unspecified: Secondary | ICD-10-CM

## 2021-09-06 HISTORY — PX: LEFT HEART CATH AND CORONARY ANGIOGRAPHY: CATH118249

## 2021-09-06 LAB — COMPREHENSIVE METABOLIC PANEL
ALT: 23 U/L (ref 0–44)
AST: 53 U/L — ABNORMAL HIGH (ref 15–41)
Albumin: 3.8 g/dL (ref 3.5–5.0)
Alkaline Phosphatase: 99 U/L (ref 38–126)
Anion gap: 9 (ref 5–15)
BUN: 15 mg/dL (ref 8–23)
CO2: 25 mmol/L (ref 22–32)
Calcium: 9.3 mg/dL (ref 8.9–10.3)
Chloride: 104 mmol/L (ref 98–111)
Creatinine, Ser: 0.87 mg/dL (ref 0.44–1.00)
GFR, Estimated: >60 mL/min (ref >60)
Glucose, Bld: 129 mg/dL — ABNORMAL HIGH (ref 70–99)
Potassium: 3.7 mmol/L (ref 3.5–5.1)
Sodium: 138 mmol/L (ref 135–145)
Total Bilirubin: 0.6 mg/dL (ref 0.3–1.2)
Total Protein: 7.6 g/dL (ref 6.5–8.1)

## 2021-09-06 LAB — CBC WITH DIFFERENTIAL/PLATELET
Abs Immature Granulocytes: 0.04 10*3/uL (ref 0.00–0.07)
Basophils Absolute: 0.1 10*3/uL (ref 0.0–0.1)
Basophils Relative: 1 %
Eosinophils Absolute: 0.2 10*3/uL (ref 0.0–0.5)
Eosinophils Relative: 2 %
HCT: 47 % — ABNORMAL HIGH (ref 36.0–46.0)
Hemoglobin: 15.2 g/dL — ABNORMAL HIGH (ref 12.0–15.0)
Immature Granulocytes: 0 %
Lymphocytes Relative: 19 %
Lymphs Abs: 1.7 10*3/uL (ref 0.7–4.0)
MCH: 27.7 pg (ref 26.0–34.0)
MCHC: 32.3 g/dL (ref 30.0–36.0)
MCV: 85.6 fL (ref 80.0–100.0)
Monocytes Absolute: 0.7 10*3/uL (ref 0.1–1.0)
Monocytes Relative: 7 %
Neutro Abs: 6.6 10*3/uL (ref 1.7–7.7)
Neutrophils Relative %: 71 %
Platelets: 320 10*3/uL (ref 150–400)
RBC: 5.49 MIL/uL — ABNORMAL HIGH (ref 3.87–5.11)
RDW: 13.1 % (ref 11.5–15.5)
WBC: 9.3 10*3/uL (ref 4.0–10.5)
nRBC: 0 % (ref 0.0–0.2)

## 2021-09-06 LAB — LIPID PANEL
Cholesterol: 231 mg/dL — ABNORMAL HIGH (ref 0–200)
HDL: 43 mg/dL (ref >40)
LDL Cholesterol: 173 mg/dL — ABNORMAL HIGH (ref 0–99)
Total CHOL/HDL Ratio: 5.4 RATIO
Triglycerides: 75 mg/dL (ref <150)
VLDL: 15 mg/dL (ref 0–40)

## 2021-09-06 LAB — POCT ACTIVATED CLOTTING TIME: Activated Clotting Time: 329 seconds

## 2021-09-06 LAB — RESP PANEL BY RT-PCR (FLU A&B, COVID) ARPGX2
Influenza A by PCR: NEGATIVE
Influenza B by PCR: NEGATIVE
SARS Coronavirus 2 by RT PCR: NEGATIVE

## 2021-09-06 LAB — APTT: aPTT: 69 seconds — ABNORMAL HIGH (ref 24–36)

## 2021-09-06 LAB — HEMOGLOBIN A1C
Hgb A1c MFr Bld: 5.5 % (ref 4.8–5.6)
Mean Plasma Glucose: 111.15 mg/dL

## 2021-09-06 LAB — TROPONIN I (HIGH SENSITIVITY)
Troponin I (High Sensitivity): 4068 ng/L (ref <18)
Troponin I (High Sensitivity): 5257 ng/L (ref <18)

## 2021-09-06 LAB — BRAIN NATRIURETIC PEPTIDE: B Natriuretic Peptide: 150.2 pg/mL — ABNORMAL HIGH (ref 0.0–100.0)

## 2021-09-06 LAB — ABO/RH: ABO/RH(D): O POS

## 2021-09-06 LAB — HIV ANTIBODY (ROUTINE TESTING W REFLEX): HIV Screen 4th Generation wRfx: NONREACTIVE

## 2021-09-06 LAB — TSH: TSH: 2.521 u[IU]/mL (ref 0.350–4.500)

## 2021-09-06 LAB — PROTIME-INR
INR: 1 (ref 0.8–1.2)
Prothrombin Time: 13.5 seconds (ref 11.4–15.2)

## 2021-09-06 LAB — TYPE AND SCREEN
ABO/RH(D): O POS
Antibody Screen: NEGATIVE

## 2021-09-06 SURGERY — LEFT HEART CATH AND CORONARY ANGIOGRAPHY
Anesthesia: LOCAL

## 2021-09-06 MED ORDER — HEPARIN SODIUM (PORCINE) 1000 UNIT/ML IJ SOLN
INTRAMUSCULAR | Status: DC | PRN
  Administered 2021-09-06 (×2): 5000 [IU] via INTRAVENOUS

## 2021-09-06 MED ORDER — MIDAZOLAM HCL 2 MG/2ML IJ SOLN
INTRAMUSCULAR | Status: AC
  Filled 2021-09-06: qty 2

## 2021-09-06 MED ORDER — SODIUM CHLORIDE 0.9 % IV SOLN
250.0000 mL | INTRAVENOUS | Status: DC | PRN
Start: 2021-09-06 — End: 2021-09-07

## 2021-09-06 MED ORDER — SODIUM CHLORIDE 0.9 % WEIGHT BASED INFUSION: 1.0000 mL/kg/h | INTRAVENOUS | Status: DC

## 2021-09-06 MED ORDER — TICAGRELOR 90 MG PO TABS
90.0000 mg | ORAL_TABLET | Freq: Two times a day (BID) | ORAL | Status: DC
  Administered 2021-09-06 – 2021-09-07 (×2): 90 mg via ORAL
  Filled 2021-09-06 (×2): qty 1

## 2021-09-06 MED ORDER — ACETAMINOPHEN 325 MG PO TABS
650.0000 mg | ORAL_TABLET | ORAL | Status: DC | PRN
  Administered 2021-09-06: 650 mg via ORAL
  Filled 2021-09-06: qty 2

## 2021-09-06 MED ORDER — LIDOCAINE HCL (PF) 1 % IJ SOLN
INTRAMUSCULAR | Status: AC
  Filled 2021-09-06: qty 30

## 2021-09-06 MED ORDER — FENTANYL CITRATE (PF) 100 MCG/2ML IJ SOLN
INTRAMUSCULAR | Status: AC
  Filled 2021-09-06: qty 2

## 2021-09-06 MED ORDER — FENTANYL CITRATE (PF) 100 MCG/2ML IJ SOLN
INTRAMUSCULAR | Status: DC | PRN
  Administered 2021-09-06: 25 ug via INTRAVENOUS

## 2021-09-06 MED ORDER — ASPIRIN 81 MG PO CHEW: 81.0000 mg | CHEWABLE_TABLET | ORAL | Status: DC

## 2021-09-06 MED ORDER — NITROGLYCERIN 0.4 MG SL SUBL: 0.4000 mg | SUBLINGUAL_TABLET | SUBLINGUAL | Status: DC | PRN

## 2021-09-06 MED ORDER — HYDRALAZINE HCL 20 MG/ML IJ SOLN: 10.0000 mg | INTRAMUSCULAR | Status: AC | PRN

## 2021-09-06 MED ORDER — HEPARIN BOLUS VIA INFUSION
4000.0000 [IU] | Freq: Once | INTRAVENOUS | Status: AC
  Administered 2021-09-06: 4000 [IU] via INTRAVENOUS
  Filled 2021-09-06: qty 4000

## 2021-09-06 MED ORDER — ROSUVASTATIN CALCIUM 20 MG PO TABS
40.0000 mg | ORAL_TABLET | Freq: Every day | ORAL | Status: DC
  Administered 2021-09-07: 40 mg via ORAL
  Filled 2021-09-06: qty 2

## 2021-09-06 MED ORDER — LISINOPRIL 5 MG PO TABS
5.0000 mg | ORAL_TABLET | Freq: Every day | ORAL | Status: DC
  Administered 2021-09-06 – 2021-09-07 (×2): 5 mg via ORAL
  Filled 2021-09-06 (×2): qty 1

## 2021-09-06 MED ORDER — METOPROLOL TARTRATE 12.5 MG HALF TABLET
12.5000 mg | ORAL_TABLET | Freq: Two times a day (BID) | ORAL | Status: DC
  Administered 2021-09-06 – 2021-09-07 (×3): 12.5 mg via ORAL
  Filled 2021-09-06 (×3): qty 1

## 2021-09-06 MED ORDER — ROSUVASTATIN CALCIUM 20 MG PO TABS
20.0000 mg | ORAL_TABLET | Freq: Every day | ORAL | Status: DC
  Administered 2021-09-06: 20 mg via ORAL
  Filled 2021-09-06: qty 1

## 2021-09-06 MED ORDER — ASPIRIN EC 81 MG PO TBEC
81.0000 mg | DELAYED_RELEASE_TABLET | Freq: Every day | ORAL | Status: DC
  Administered 2021-09-07: 81 mg via ORAL
  Filled 2021-09-06: qty 1

## 2021-09-06 MED ORDER — MIDAZOLAM HCL 2 MG/2ML IJ SOLN
INTRAMUSCULAR | Status: DC | PRN
  Administered 2021-09-06: 2 mg via INTRAVENOUS

## 2021-09-06 MED ORDER — FUROSEMIDE 10 MG/ML IJ SOLN
20.0000 mg | Freq: Once | INTRAMUSCULAR | Status: AC
Start: 2021-09-06 — End: 2021-09-06
  Administered 2021-09-06: 20 mg via INTRAVENOUS
  Filled 2021-09-06: qty 2

## 2021-09-06 MED ORDER — TICAGRELOR 90 MG PO TABS
ORAL_TABLET | ORAL | Status: AC
  Filled 2021-09-06: qty 2

## 2021-09-06 MED ORDER — HEPARIN (PORCINE) 25000 UT/250ML-% IV SOLN
850.0000 [IU]/h | INTRAVENOUS | Status: DC
  Administered 2021-09-06: 850 [IU]/h via INTRAVENOUS
  Filled 2021-09-06: qty 250

## 2021-09-06 MED ORDER — SODIUM CHLORIDE 0.9% FLUSH
3.0000 mL | Freq: Two times a day (BID) | INTRAVENOUS | Status: DC
  Administered 2021-09-06 – 2021-09-07 (×2): 3 mL via INTRAVENOUS

## 2021-09-06 MED ORDER — HEPARIN (PORCINE) IN NACL 1000-0.9 UT/500ML-% IV SOLN
INTRAVENOUS | Status: AC
  Filled 2021-09-06: qty 500

## 2021-09-06 MED ORDER — LIDOCAINE HCL (PF) 1 % IJ SOLN
INTRAMUSCULAR | Status: DC | PRN
  Administered 2021-09-06: 2 mL

## 2021-09-06 MED ORDER — VERAPAMIL HCL 2.5 MG/ML IV SOLN
INTRAVENOUS | Status: DC | PRN
  Administered 2021-09-06: 10 mL via INTRA_ARTERIAL

## 2021-09-06 MED ORDER — NICOTINE 14 MG/24HR TD PT24
14.0000 mg | MEDICATED_PATCH | Freq: Every day | TRANSDERMAL | Status: DC
  Administered 2021-09-07: 14 mg via TRANSDERMAL
  Filled 2021-09-06: qty 1

## 2021-09-06 MED ORDER — TICAGRELOR 90 MG PO TABS
ORAL_TABLET | ORAL | Status: DC | PRN
  Administered 2021-09-06: 180 mg via ORAL

## 2021-09-06 MED ORDER — NITROGLYCERIN 1 MG/10 ML FOR IR/CATH LAB
INTRA_ARTERIAL | Status: AC
  Filled 2021-09-06: qty 10

## 2021-09-06 MED ORDER — SODIUM CHLORIDE 0.9 % IV SOLN: INTRAVENOUS | Status: AC

## 2021-09-06 MED ORDER — LABETALOL HCL 5 MG/ML IV SOLN: 10.0000 mg | INTRAVENOUS | Status: AC | PRN

## 2021-09-06 MED ORDER — HEPARIN (PORCINE) IN NACL 1000-0.9 UT/500ML-% IV SOLN
INTRAVENOUS | Status: DC | PRN
  Administered 2021-09-06 (×2): 500 mL

## 2021-09-06 MED ORDER — NITROGLYCERIN IN D5W 200-5 MCG/ML-% IV SOLN
0.0000 ug/min | INTRAVENOUS | Status: DC
  Administered 2021-09-06: 5 ug/min via INTRAVENOUS
  Filled 2021-09-06: qty 250

## 2021-09-06 MED ORDER — HEPARIN SODIUM (PORCINE) 1000 UNIT/ML IJ SOLN
INTRAMUSCULAR | Status: AC
  Filled 2021-09-06: qty 10

## 2021-09-06 MED ORDER — ASPIRIN 81 MG PO CHEW
324.0000 mg | CHEWABLE_TABLET | Freq: Once | ORAL | Status: AC
  Administered 2021-09-06: 324 mg via ORAL
  Filled 2021-09-06: qty 4

## 2021-09-06 MED ORDER — ONDANSETRON HCL 4 MG/2ML IJ SOLN: 4.0000 mg | Freq: Four times a day (QID) | INTRAMUSCULAR | Status: DC | PRN

## 2021-09-06 MED ORDER — VERAPAMIL HCL 2.5 MG/ML IV SOLN
INTRAVENOUS | Status: AC
  Filled 2021-09-06: qty 2

## 2021-09-06 MED ORDER — HYDROXYZINE HCL 25 MG PO TABS
25.0000 mg | ORAL_TABLET | Freq: Once | ORAL | Status: AC
  Administered 2021-09-06: 25 mg via ORAL
  Filled 2021-09-06: qty 1

## 2021-09-06 MED ORDER — IODIXANOL 320 MG/ML IV SOLN
INTRAVENOUS | Status: DC | PRN
  Administered 2021-09-06: 125 mL via INTRA_ARTERIAL

## 2021-09-06 MED ORDER — ALPRAZOLAM 0.25 MG PO TABS
0.1250 mg | ORAL_TABLET | Freq: Once | ORAL | Status: AC
  Administered 2021-09-06: 0.125 mg via ORAL
  Filled 2021-09-06: qty 1

## 2021-09-06 MED ORDER — SODIUM CHLORIDE 0.9% FLUSH: 3.0000 mL | INTRAVENOUS | Status: DC | PRN

## 2021-09-06 MED ORDER — SODIUM CHLORIDE 0.9 % WEIGHT BASED INFUSION: 3.0000 mL/kg/h | INTRAVENOUS | Status: DC

## 2021-09-06 SURGICAL SUPPLY — 21 items
BALLN SAPPHIRE 2.0X12 (BALLOONS) ×2
BALLOON SAPPHIRE 2.0X12 (BALLOONS) IMPLANT
CATH OPTITORQUE TIG 4.0 5F (CATHETERS) ×1 IMPLANT
CATH VISTA GUIDE 6FR XB3.5 (CATHETERS) ×1 IMPLANT
DEVICE RAD COMP TR BAND LRG (VASCULAR PRODUCTS) ×1 IMPLANT
GLIDESHEATH SLEND A-KIT 6F 22G (SHEATH) IMPLANT
GLIDESHEATH SLEND SS 6F .021 (SHEATH) ×1 IMPLANT
GUIDEWIRE INQWIRE 1.5J.035X260 (WIRE) IMPLANT
INQWIRE 1.5J .035X260CM (WIRE) ×2
KIT ENCORE 26 ADVANTAGE (KITS) ×1 IMPLANT
KIT HEART LEFT (KITS) ×2 IMPLANT
PACK CARDIAC CATHETERIZATION (CUSTOM PROCEDURE TRAY) ×2 IMPLANT
SHEATH PROBE COVER 6X72 (BAG) ×1 IMPLANT
STENT SYNERGY XD 2.50X16 (Permanent Stent) IMPLANT
SYNERGY XD 2.50X16 (Permanent Stent) ×2 IMPLANT
SYR MEDRAD MARK 7 150ML (SYRINGE) ×2 IMPLANT
TRANSDUCER W/STOPCOCK (MISCELLANEOUS) ×2 IMPLANT
TUBING CIL FLEX 10 FLL-RA (TUBING) ×2 IMPLANT
WIRE ASAHI PROWATER 180CM (WIRE) ×1 IMPLANT
WIRE HI TORQ BMW 190CM (WIRE) ×1 IMPLANT
WIRE HI TORQ WHISPER MS 190CM (WIRE) ×1 IMPLANT

## 2021-09-06 NOTE — H&P (View-Only) (Signed)
° °Progress Note ° °Patient Name: Miranda Little °Date of Encounter: 09/06/2021 ° °[facility] Cardiologist: None New ° °Subjective  ° °Resolved chest pain on nitro and heparin drip. No dyspnea.  ° °Inpatient Medications  °  °Scheduled Meds: ° [START ON 09/07/2021] aspirin EC  81 mg Oral Daily  ° lisinopril  5 mg Oral Daily  ° metoprolol tartrate  12.5 mg Oral BID  ° nicotine  14 mg Transdermal Daily  ° rosuvastatin  20 mg Oral Daily  ° °Continuous Infusions: ° heparin 850 Units/hr (09/06/21 0339)  ° nitroGLYCERIN 10 mcg/min (09/06/21 0505)  ° °PRN Meds: °nitroGLYCERIN  ° °Vital Signs  °  °Vitals:  ° 09/06/21 0545 09/06/21 0600 09/06/21 0615 09/06/21 0645  °BP: 138/77 126/71 (!) 149/74 137/83  °Pulse: 81 74 74 76  °Resp: 19 19 20 (!) 23  °Temp:      °TempSrc:      °SpO2: 92% 93% 93% 93%  °Weight:      °Height:      ° ° °Intake/Output Summary (Last 24 hours) at 09/06/2021 0735 °Last data filed at 09/06/2021 0505 °Gross per 24 hour  °Intake 2.09 ml  °Output --  °Net 2.09 ml  ° °Last 3 Weights 09/06/2021 06/30/2017 01/31/2016  °Weight (lbs) 210 lb 228 lb 9.6 oz 219 lb 3.2 oz  °Weight (kg) 95.255 kg 103.692 kg 99.428 kg  °   ° °Telemetry  °  °NSR - Personally Reviewed ° °ECG  °  °SR, IRBBB - Personally Reviewed ° °Physical Exam  ° °GEN: No acute distress.   °Neck: No JVD °Cardiac: RRR, no murmurs, rubs, or gallops.  °Respiratory: Clear to auscultation bilaterally. °GI: Soft, nontender, non-distended  °MS: No edema; No deformity. °Neuro:  Nonfocal  °Psych: Normal affect  ° °Labs  °  °High Sensitivity Troponin:   °Recent Labs  °Lab 09/06/21 °0149 09/06/21 °0335  °TROPONINIHS 4,068* 5,257*  °   °Chemistry °Recent Labs  °Lab 09/06/21 °0149  °NA 138  °K 3.7  °CL 104  °CO2 25  °GLUCOSE 129*  °BUN 15  °CREATININE 0.87  °CALCIUM 9.3  °PROT 7.6  °ALBUMIN 3.8  °AST 53*  °ALT 23  °ALKPHOS 99  °BILITOT 0.6  °GFRNONAA >60  °ANIONGAP 9  °  °Lipids  °Recent Labs  °Lab 09/06/21 °0510  °CHOL 231*  °TRIG 75  °HDL 43  °LDLCALC 173*   °CHOLHDL 5.4  °  °Hematology °Recent Labs  °Lab 09/06/21 °0149  °WBC 9.3  °RBC 5.49*  °HGB 15.2*  °HCT 47.0*  °MCV 85.6  °MCH 27.7  °MCHC 32.3  °RDW 13.1  °PLT 320  ° °Thyroid  °Recent Labs  °Lab 09/06/21 °0510  °TSH 2.521  °  °BNP °Recent Labs  °Lab 09/06/21 °0510  °BNP 150.2*  °  ° °Radiology  °  °DG Chest 2 View ° °Result Date: 09/06/2021 °CLINICAL DATA:  Chest pain for 2 days, initial encounter EXAM: CHEST - 2 VIEW COMPARISON:  03/18/2015 FINDINGS: Check shadow is stable. Mild central vascular congestion is noted with interstitial edema. No sizable effusion is noted. No bony abnormality is seen. IMPRESSION: Mild changes of CHF. Electronically Signed   By: Mark  Lukens M.D.   On: 09/06/2021 02:38   ° °Cardiac Studies  ° °Pending cath and echo ° °Patient Profile  °   °65 y.o. female with hx of tobacco abuse and obesity presented for CP evaluation and found to have elevated troponin.  ° ° °Assessment & Plan  °  °  NSTEMI °- Hs-troponin 4068>>5257. Currently CP free on nitro and heparin drip.  °- The patient understands that risks include but are not limited to stroke (1 in 1000), death (1 in 1000), kidney failure [usually temporary] (1 in 500), bleeding (1 in 200), allergic reaction [possibly serious] (1 in 200), and agrees to proceed.    °- Continue ASA, statin, BB and ACE °-09/06/2021: Cholesterol 231; HDL 43; LDL Cholesterol 173; Triglycerides 75; VLDL 15  °-HgbA1c 5.5 ° °2. New HTN °- BP elevated on admission °- Started on BB and ACE with improved BP ° °3. Tobacco abuse °- recommended cessation  °- On nicotine patch  ° °For questions or updates, please contact [facility] °Please consult [email or web address] for contact info under  ° °  °   °Signed, °Bora Bost, PA  °09/06/2021, 7:35 AM    °

## 2021-09-06 NOTE — Progress Notes (Signed)
Report and care received from [facility]. Patient resting comfortable right now. VSS. Rt Radial site remains soft. TR band in place. Will continue to monitor patient.

## 2021-09-06 NOTE — ED Notes (Signed)
Cath Lab staff at bedside to transport.

## 2021-09-06 NOTE — ED Provider Triage Note (Signed)
Emergency Medicine Provider Triage Evaluation Note  Miranda Little , a 66 y.o. female  was evaluated in triage.  Pt complains of chest pain.  States her symptoms started last night.  They have been intermittent.  Describes it as a diffuse chest heaviness.  Reports associated shortness of breath.  Also notes tingling and numbness in the left arm.  She is a smoker.  Physical Exam  BP (!) 173/96    Pulse (!) 108    Temp 97.8 F (36.6 C) (Oral)    Resp (!) 22    SpO2 99%  Gen:   Awake, no distress   Resp:  Normal effort  MSK:   Moves extremities without difficulty  Other:    Medical Decision Making  Medically screening exam initiated at 1:39 AM.  Appropriate orders placed.  Miranda Little was informed that the remainder of the evaluation will be completed by another provider, this initial triage assessment does not replace that evaluation, and the importance of remaining in the ED until their evaluation is complete.   Miranda Sou, PA-C 09/06/21 0140

## 2021-09-06 NOTE — H&P (Signed)
Cardiology Admission History and Physical:   Patient ID: Miranda Little MRN: [id number]; DOB: 1955-11-21   Admission date: 09/06/2021  PCP:  Johna Sheriff, MD (Inactive)   [facility] Providers Cardiologist:  None        Chief Complaint: Chest pain  Patient Profile:   Miranda Little is a 66 y.o. female with longstanding tobacco abuse and obesity who is being seen 09/06/2021 for the evaluation of NSTEMI.  History of Present Illness:   [name] reports that on Sunday she was awoken from her sleep with severe central chest pressure with associated left arm numbness.  She has never had anything like this before.  She had company over so did not want to go to the ED and frightening 1.  Over the next few days however, her chest pressure recurred intermittently with associated shortness of breath.  She denies syncope, presyncope, swelling, PND, orthopnea, palpitations, nausea, abdominal pain, weakness, numbness, urinary symptoms.  She has no family history of cardiac illness.  She states that she has smoked a little less than a pack of cigarettes daily since she was in high school.  She denies illicit drug use and alcohol use.  She is a retired Runner, broadcasting/film/video.  Eventually, she told her husband about her chest pain symptoms who then prompted her to come to the ED for evaluation.  In the ED her VS were afebrile, HR 108, BP 173/96, RR 22, satting 90% on RA.  Labs notable for troponin of 4068.  EKG without ischemic changes.  CXR showed cardiomegaly with bilateral interstitial edema.  In the ED the patient was given heparin and aspirin loaded.  The patient is currently chest pain-free.  She is being admitted to cardiology for an NSTEMI.   History reviewed. No pertinent past medical history.  Past Surgical History:  Procedure Laterality Date   CARPAL TUNNEL RELEASE     CESAREAN SECTION     CHOLECYSTECTOMY N/A 03/18/2015   Procedure: LAPAROSCOPIC CHOLECYSTECTOMY WITH INTRAOPERATIVE  CHOLANGIOGRAM;  Surgeon: Harriette Bouillon, MD;  Location: [facility] OR;  Service: General;  Laterality: N/A;   CYST REMOVAL NECK     ERCP N/A 03/19/2015   Procedure: ENDOSCOPIC RETROGRADE CHOLANGIOPANCREATOGRAPHY (ERCP);  Surgeon: Vida Rigger, MD;  Location: [facility];  Service: Endoscopy;  Laterality: N/A;   KNEE SURGERY Right      Medications Prior to Admission: Prior to Admission medications   Medication Sig Start Date End Date Taking? Authorizing Provider  acetaminophen (TYLENOL) 325 MG tablet Take 650 mg by mouth every 6 (six) hours as needed for mild pain.    [provider]  ALPRAZolam Prudy Feeler) 1 MG tablet Take 1 mg by mouth at bedtime as needed for anxiety.    [provider]  amoxicillin (AMOXIL) 875 MG tablet Take 1 tablet (875 mg total) by mouth 2 (two) times daily. 06/30/17   Johna Sheriff, MD  clindamycin (CLEOCIN) 300 MG capsule Take 1 capsule (300 mg total) by mouth 3 (three) times daily. 06/30/17   Johna Sheriff, MD  fluconazole (DIFLUCAN) 150 MG tablet Take 1 tablet (150 mg total) by mouth every three (3) days as needed. 06/30/17   Johna Sheriff, MD  ibuprofen (ADVIL,MOTRIN) 200 MG tablet Take 200 mg by mouth every 6 (six) hours as needed.    [provider]     Allergies:    Allergies  Allergen Reactions   Darvocet [Propoxyphene N-Acetaminophen] Hives   Sulfa Antibiotics     Social History:  Social History   Socioeconomic History   Marital status: Single    Spouse name: Not on file   Number of children: Not on file   Years of education: Not on file   Highest education level: Not on file  Occupational History   Not on file  Tobacco Use   Smoking status: Every Day    Packs/day: 0.50    Years: 35.00    Pack years: 17.50    Types: Cigarettes   Smokeless tobacco: Never  Substance and Sexual Activity   Alcohol use: Yes    Comment: occasionally   Drug use: No   Sexual activity: Not on file  Other Topics Concern   Not on file   Social History Narrative   Not on file   Social Determinants of Health   Financial Resource Strain: Not on file  Food Insecurity: Not on file  Transportation Needs: Not on file  Physical Activity: Not on file  Stress: Not on file  Social Connections: Not on file  Intimate Partner Violence: Not on file    Family History:   The patient's family history includes Cancer in her father; Hypertension in her sister; Stroke in her father; Tuberculosis in her mother.    ROS:  Please see the history of present illness.  All other ROS reviewed and negative.     Physical Exam/Data:   Vitals:   09/06/21 0315 09/06/21 0340 09/06/21 0400 09/06/21 0415  BP: (!) 163/111 (!) 156/88 140/79 (!) 165/88  Pulse: 95 (!) 105 93 90  Resp: (!) 24 (!) 21 (!) 30 (!) 23  Temp:      TempSrc:      SpO2: 100%  93% 94%   No intake or output data in the 24 hours ending 09/06/21 0427 Last 3 Weights 06/30/2017 01/31/2016 03/17/2015  Weight (lbs) 228 lb 9.6 oz 219 lb 3.2 oz 215 lb  Weight (kg) 103.692 kg 99.428 kg 97.523 kg     There is no height or weight on file to calculate BMI.  General:  Well nourished, well developed, in no acute distress HEENT: normal Neck: no JVD Vascular: No carotid bruits; Distal pulses 2+ bilaterally   Cardiac:  normal S1, S2; RRR; no murmur Lungs: Bilateral rales heard throughout lung fields, few expiratory wheezes, no rhonchi Abd: soft, nontender, no hepatomegaly  Ext: no edema Musculoskeletal:  No deformities, BUE and BLE strength normal and equal Skin: warm and dry  Neuro:  CNs 2-12 intact, no focal abnormalities noted Psych:  Normal affect    EKG:  The ECG that was done  was personally reviewed and demonstrates NSR    Relevant CV Studies: None  Laboratory Data:  High Sensitivity Troponin:   Recent Labs  Lab 09/06/21 0149  TROPONINIHS 4,068*      Chemistry Recent Labs  Lab 09/06/21 0149  NA 138  K 3.7  CL 104  CO2 25  GLUCOSE 129*  BUN 15   CREATININE 0.87  CALCIUM 9.3  GFRNONAA >60  ANIONGAP 9    Recent Labs  Lab 09/06/21 0149  PROT 7.6  ALBUMIN 3.8  AST 53*  ALT 23  ALKPHOS 99  BILITOT 0.6   Lipids No results for input(s): CHOL, TRIG, HDL, LABVLDL, LDLCALC, CHOLHDL in the last 168 hours. Hematology Recent Labs  Lab 09/06/21 0149  WBC 9.3  RBC 5.49*  HGB 15.2*  HCT 47.0*  MCV 85.6  MCH 27.7  MCHC 32.3  RDW 13.1  PLT 320   Thyroid  No results for input(s): TSH, FREET4 in the last 168 hours. BNPNo results for input(s): BNP, PROBNP in the last 168 hours.  DDimer No results for input(s): DDIMER in the last 168 hours.   Radiology/Studies:  DG Chest 2 View  Result Date: 09/06/2021 CLINICAL DATA:  Chest pain for 2 days, initial encounter EXAM: CHEST - 2 VIEW COMPARISON:  03/18/2015 FINDINGS: Check shadow is stable. Mild central vascular congestion is noted with interstitial edema. No sizable effusion is noted. No bony abnormality is seen. IMPRESSION: Mild changes of CHF. Electronically Signed   By: Alcide Clever M.D.   On: 09/06/2021 02:38     Assessment and Plan:   Rivers W Schleich is a 66 y.o. female with longstanding tobacco abuse and obesity who is being seen 09/06/2021 for the evaluation of NSTEMI.  #NSTEMI :: Elevated troponins with classic symptoms of ACS are consistent with an NSTEMI.  Risk factors include heavy tobacco abuse, obesity, likely undiagnosed HTN.  Patient will need an LHC.  We will also start secondary prevention with lisinopril, metoprolol and statin.  Order TTE to evaluate for cardiac dysfunction. -N.p.o. -LHC -Continue heparin -Continue ASA 81 mg daily -Start lisinopril 5 mg daily -Start metoprolol tartrate 12.5 mg twice daily -Start rosuvastatin 20 mg daily -TTE -Cardiac rehab at discharge -Hemoglobin A1c, TSH, lipid panel, BNP  #Tobacco Abuse -Substance use counseling -Nicotine patch   Risk Assessment/Risk Scores:    TIMI Risk Score for Unstable Angina or Non-ST  Elevation MI:   The patient's TIMI risk score is 3, which indicates a 13% risk of all cause mortality, new or recurrent myocardial infarction or need for urgent revascularization in the next 14 days.       Severity of Illness: The appropriate patient status for this patient is INPATIENT. Inpatient status is judged to be reasonable and necessary in order to provide the required intensity of service to ensure the patient's safety. The patient's presenting symptoms, physical exam findings, and initial radiographic and laboratory data in the context of their chronic comorbidities is felt to place them at high risk for further clinical deterioration. Furthermore, it is not anticipated that the patient will be medically stable for discharge from the hospital within 2 midnights of admission.   * I certify that at the point of admission it is my clinical judgment that the patient will require inpatient hospital care spanning beyond 2 midnights from the point of admission due to high intensity of service, high risk for further deterioration and high frequency of surveillance required.*   For questions or updates, please contact [facility] Please consult [email or web address] for contact info under     Signed, Karl Ito, MD  09/06/2021 4:27 AM

## 2021-09-06 NOTE — Progress Notes (Signed)
Gauze with clear dressing applied to right radial site. Site is soft and strong pulse palpated.

## 2021-09-06 NOTE — Progress Notes (Signed)
Progress Note  Patient Name: Miranda Little Date of Encounter: 09/06/2021  [facility] Cardiologist: None New  Subjective   Resolved chest pain on nitro and heparin drip. No dyspnea.   Inpatient Medications    Scheduled Meds:  [START ON 09/07/2021] aspirin EC  81 mg Oral Daily   lisinopril  5 mg Oral Daily   metoprolol tartrate  12.5 mg Oral BID   nicotine  14 mg Transdermal Daily   rosuvastatin  20 mg Oral Daily   Continuous Infusions:  heparin 850 Units/hr (09/06/21 0339)   nitroGLYCERIN 10 mcg/min (09/06/21 0505)   PRN Meds: nitroGLYCERIN   Vital Signs    Vitals:   09/06/21 0545 09/06/21 0600 09/06/21 0615 09/06/21 0645  BP: 138/77 126/71 (!) 149/74 137/83  Pulse: 81 74 74 76  Resp: 19 19 20  (!) 23  Temp:      TempSrc:      SpO2: 92% 93% 93% 93%  Weight:      Height:        Intake/Output Summary (Last 24 hours) at 09/06/2021 0735 Last data filed at 09/06/2021 0505 Gross per 24 hour  Intake 2.09 ml  Output --  Net 2.09 ml   Last 3 Weights 09/06/2021 06/30/2017 01/31/2016  Weight (lbs) 210 lb 228 lb 9.6 oz 219 lb 3.2 oz  Weight (kg) 95.255 kg 103.692 kg 99.428 kg      Telemetry    NSR - Personally Reviewed  ECG    SR, IRBBB - Personally Reviewed  Physical Exam   GEN: No acute distress.   Neck: No JVD Cardiac: RRR, no murmurs, rubs, or gallops.  Respiratory: Clear to auscultation bilaterally. GI: Soft, nontender, non-distended  MS: No edema; No deformity. Neuro:  Nonfocal  Psych: Normal affect   Labs    High Sensitivity Troponin:   Recent Labs  Lab 09/06/21 0149 09/06/21 0335  TROPONINIHS 4,068* 5,257*     Chemistry Recent Labs  Lab 09/06/21 0149  NA 138  K 3.7  CL 104  CO2 25  GLUCOSE 129*  BUN 15  CREATININE 0.87  CALCIUM 9.3  PROT 7.6  ALBUMIN 3.8  AST 53*  ALT 23  ALKPHOS 99  BILITOT 0.6  GFRNONAA >60  ANIONGAP 9    Lipids  Recent Labs  Lab 09/06/21 0510  CHOL 231*  TRIG 75  HDL 43  LDLCALC 173*   CHOLHDL 5.4    Hematology Recent Labs  Lab 09/06/21 0149  WBC 9.3  RBC 5.49*  HGB 15.2*  HCT 47.0*  MCV 85.6  MCH 27.7  MCHC 32.3  RDW 13.1  PLT 320   Thyroid  Recent Labs  Lab 09/06/21 0510  TSH 2.521    BNP Recent Labs  Lab 09/06/21 0510  BNP 150.2*     Radiology    DG Chest 2 View  Result Date: 09/06/2021 CLINICAL DATA:  Chest pain for 2 days, initial encounter EXAM: CHEST - 2 VIEW COMPARISON:  03/18/2015 FINDINGS: Check shadow is stable. Mild central vascular congestion is noted with interstitial edema. No sizable effusion is noted. No bony abnormality is seen. IMPRESSION: Mild changes of CHF. Electronically Signed   By: Inez Catalina M.D.   On: 09/06/2021 02:38    Cardiac Studies   Pending cath and echo  Patient Profile     66 y.o. female with hx of tobacco abuse and obesity presented for CP evaluation and found to have elevated troponin.    Assessment & Plan  NSTEMI - Hs-troponin [id number]. Currently CP free on nitro and heparin drip.  - The patient understands that risks include but are not limited to stroke (1 in 1000), death (1 in 68), kidney failure [usually temporary] (1 in 500), bleeding (1 in 200), allergic reaction [possibly serious] (1 in 200), and agrees to proceed.    - Continue ASA, statin, BB and ACE -09/06/2021: Cholesterol 231; HDL 43; LDL Cholesterol 173; Triglycerides 75; VLDL 15  -HgbA1c 5.5  2. New HTN - BP elevated on admission - Started on BB and ACE with improved BP  3. Tobacco abuse - recommended cessation  - On nicotine patch   For questions or updates, please contact [place] Please consult [email or web address] for contact info under        SignedLeanor Kail, PA  09/06/2021, 7:35 AM

## 2021-09-06 NOTE — ED Triage Notes (Signed)
Pt reports CP and SOB X2 nights.  Sudden onset of left sided chest pain that goes to her left arm causing numbness.  No hx.

## 2021-09-06 NOTE — ED Notes (Signed)
Pt reports that her chest pain has subsided at this time - pt trying to get some rest at this time - family remains at bedside

## 2021-09-06 NOTE — Interval H&P Note (Signed)
History and Physical Interval Note:  09/06/2021 9:17 AM  Miranda Little  has presented today for surgery, with the diagnosis of chest pain/non-STEMI.  The various methods of treatment have been discussed with the patient and family. After consideration of risks, benefits and other options for treatment, the patient has consented to  Procedure(s): LEFT HEART CATH AND CORONARY ANGIOGRAPHY (N/A) PERCUTANEOUS CORONARY INTERVENTION   as a surgical intervention.  The patient's history has been reviewed, patient examined, no change in status, stable for surgery.  I have reviewed the patient's chart and labs.  Questions were answered to the patient's satisfaction.    Cath Lab Visit (complete for each Cath Lab visit)  Clinical Evaluation Leading to the Procedure:   ACS: Yes.    Non-ACS:    Anginal Classification: CCS IV  Anti-ischemic medical therapy: Minimal Therapy (1 class of medications)  Non-Invasive Test Results: No non-invasive testing performed  Prior CABG: No previous CABG     Bryan Lemma

## 2021-09-06 NOTE — ED Provider Notes (Signed)
[facility] EMERGENCY DEPARTMENT Provider Note   CSN: [id number] Arrival date & time: 09/06/21  0129     History  Chief Complaint  Patient presents with   Chest Pain    Miranda Little is a 66 y.o. female.  HPI     This is a 66 year old female who is a current everyday smoker who presents with chest pain.  Patient reports onset of chest pain on Sunday night.  She states that it was pressure-like and heavy.  It at times radiated to the left arm.  She had difficulty sleeping on Sunday night.  She had some improvement during the day on Monday.  Does not necessarily note exertional symptoms.  However, pain continued throughout yesterday evening and this morning.  Currently pain is 5 out of 10.  She denies any shortness of breath.  No recent fevers or cough.  No leg swelling.  No known history of heart disease.  Denies hypertension, hyperlipidemia, diabetes.  No early family history of heart disease  Home Medications Prior to Admission medications   Medication Sig Start Date End Date Taking? Authorizing Provider  acetaminophen (TYLENOL) 325 MG tablet Take 650 mg by mouth every 6 (six) hours as needed for mild pain.    [provider]  ALPRAZolam Duanne Moron) 1 MG tablet Take 1 mg by mouth at bedtime as needed for anxiety.    [provider]  amoxicillin (AMOXIL) 875 MG tablet Take 1 tablet (875 mg total) by mouth 2 (two) times daily. 06/30/17   Eustaquio Maize, MD  clindamycin (CLEOCIN) 300 MG capsule Take 1 capsule (300 mg total) by mouth 3 (three) times daily. 06/30/17   Eustaquio Maize, MD  fluconazole (DIFLUCAN) 150 MG tablet Take 1 tablet (150 mg total) by mouth every three (3) days as needed. 06/30/17   Eustaquio Maize, MD  ibuprofen (ADVIL,MOTRIN) 200 MG tablet Take 200 mg by mouth every 6 (six) hours as needed.    [provider]      Allergies    Darvocet [propoxyphene n-acetaminophen] and Sulfa antibiotics    Review of Systems    Review of Systems  Cardiovascular:  Positive for chest pain. Negative for leg swelling.  All other systems reviewed and are negative.  Physical Exam Updated Vital Signs BP (!) 156/88    Pulse (!) 105    Temp 97.8 F (36.6 C) (Oral)    Resp (!) 21    SpO2 100%  Physical Exam Vitals and nursing note reviewed.  Constitutional:      Appearance: She is well-developed. She is obese. She is not ill-appearing.  HENT:     Head: Normocephalic and atraumatic.  Eyes:     Pupils: Pupils are equal, round, and reactive to light.  Cardiovascular:     Rate and Rhythm: Normal rate and regular rhythm.     Heart sounds: Normal heart sounds.  Pulmonary:     Effort: Pulmonary effort is normal. No respiratory distress.     Breath sounds: Wheezing present.  Abdominal:     General: Bowel sounds are normal.     Palpations: Abdomen is soft.  Musculoskeletal:     Right lower leg: No tenderness. No edema.     Left lower leg: No tenderness. No edema.  Skin:    General: Skin is warm and dry.  Neurological:     Mental Status: She is alert and oriented to person, place, and time.  Psychiatric:  Mood and Affect: Mood normal.    ED Results / Procedures / Treatments   Labs (all labs ordered are listed, but only abnormal results are displayed) Labs Reviewed  COMPREHENSIVE METABOLIC PANEL - Abnormal; Notable for the following components:      Result Value   Glucose, Bld 129 (*)    AST 53 (*)    All other components within normal limits  CBC WITH DIFFERENTIAL/PLATELET - Abnormal; Notable for the following components:   RBC 5.49 (*)    Hemoglobin 15.2 (*)    HCT 47.0 (*)    All other components within normal limits  TROPONIN I (HIGH SENSITIVITY) - Abnormal; Notable for the following components:   Troponin I (High Sensitivity) 4,068 (*)    All other components within normal limits  RESP PANEL BY RT-PCR (FLU A&B, COVID) [id number]  TROPONIN I (HIGH SENSITIVITY)    EKG EKG  Interpretation  Date/Time:  Tuesday September 06 2021 03:39:37 EST Ventricular Rate:  91 PR Interval:  182 QRS Duration: 106 QT Interval:  372 QTC Calculation: 458 R Axis:   -85 Text Interpretation: Sinus rhythm Incomplete RBBB and LAFB Low voltage, precordial leads Consider anterior infarct Confirmed by Thayer Jew [phone]) on 09/06/2021 3:48:35 AM  Radiology DG Chest 2 View  Result Date: 09/06/2021 CLINICAL DATA:  Chest pain for 2 days, initial encounter EXAM: CHEST - 2 VIEW COMPARISON:  03/18/2015 FINDINGS: Check shadow is stable. Mild central vascular congestion is noted with interstitial edema. No sizable effusion is noted. No bony abnormality is seen. IMPRESSION: Mild changes of CHF. Electronically Signed   By: Inez Catalina M.D.   On: 09/06/2021 02:38    Procedures .Critical Care Performed by: Merryl Hacker, MD Authorized by: Merryl Hacker, MD   Critical care provider statement:    Critical care time (minutes):  60   Critical care was necessary to treat or prevent imminent or life-threatening deterioration of the following conditions:  Cardiac failure (NSTEMI)   Critical care was time spent personally by me on the following activities:  Development of treatment plan with patient or surrogate, discussions with consultants, evaluation of patient's response to treatment, examination of patient, ordering and review of laboratory studies, ordering and review of radiographic studies, ordering and performing treatments and interventions, pulse oximetry, re-evaluation of patient's condition and review of old charts    Medications Ordered in ED Medications  heparin ADULT infusion 100 units/mL (25000 units/224mL) (850 Units/hr Intravenous New Bag/Given 09/06/21 0339)  nitroGLYCERIN 50 mg in dextrose 5 % 250 mL (0.2 mg/mL) infusion (5 mcg/min Intravenous New Bag/Given 09/06/21 0339)  aspirin chewable tablet 324 mg (324 mg Oral Given 09/06/21 0329)  heparin bolus via infusion 4,000  Units (4,000 Units Intravenous Bolus from Bag 09/06/21 0340)    ED Course/ Medical Decision Making/ A&P                           Medical Decision Making Risk OTC drugs. Prescription drug management. Decision regarding hospitalization.   This patient presents to the ED for concern of chest pain, this involves an extensive number of treatment options, and is a complaint that carries with it a high risk of complications and morbidity.  The differential diagnosis includes ACS, PE, COPD, pneumonia  MDM:    This is a 66 year old female who presents with chest pain.  She is nontoxic.  Vital signs notable for blood pressure 163/111.  She also has a history of smoking  and is obese.  Pain is highly concerning for potential ACS.  EKG does not show any evidence of acute ischemic changes or ST elevation.  However, troponin is greater than 4000.  Given the pain started on Sunday, feel that she likely began to have her event on Sunday.  While PE is a possibility, she is satting 100% on room air and is hemodynamically stable.  Do not feel that she would have a clinically significant PE.  Chest x-ray shows no evidence of pneumothorax or pneumonia.  Other lab work-up is reassuring.  Patient was given chewable aspirin.  She was started on heparin and nitroglycerin.  Cardiology was consulted and will plan for admission. (Labs, imaging)  Labs: I Ordered, and personally interpreted labs.  The pertinent results include: Troponin greater than 4000  Imaging Studies ordered: I ordered imaging studies including chest x-ray I independently visualized and interpreted imaging. I agree with the radiologist interpretation  Additional history obtained from family.  External records from outside source obtained and reviewed including prior visit  Critical Interventions: IV heparin and nitroglycerin  Consultations: I requested consultation with the cardiology,  and discussed lab and imaging findings as well as  pertinent plan - they recommend: Admit for cardiac cath  Cardiac Monitoring: The patient was maintained on a cardiac monitor.  I personally viewed and interpreted the cardiac monitored which showed an underlying rhythm of: Normal sinus rhythm  Reevaluation: After the interventions noted above, I reevaluated the patient and found that they have :improved   Considered admission for: NSTEMI  Social Determinants of Health: Current smoker  Disposition: Admit  Co morbidities that complicate the patient evaluation History reviewed. No pertinent past medical history.   Medicines Meds ordered this encounter  Medications   aspirin chewable tablet 324 mg   heparin ADULT infusion 100 units/mL (25000 units/227mL)   nitroGLYCERIN 50 mg in dextrose 5 % 250 mL (0.2 mg/mL) infusion   heparin bolus via infusion 4,000 Units    I have reviewed the patients home medicines and have made adjustments as needed  Problem List / ED Course: Problem List Items Addressed This Visit   None Visit Diagnoses     NSTEMI (non-ST elevated myocardial infarction) ([place])    -  Primary   Relevant Medications   heparin ADULT infusion 100 units/mL (25000 units/230mL)   nitroGLYCERIN 50 mg in dextrose 5 % 250 mL (0.2 mg/mL) infusion                   Final Clinical Impression(s) / ED Diagnoses Final diagnoses:  NSTEMI (non-ST elevated myocardial infarction) [facility])    Rx / DC Orders ED Discharge Orders     None         Merryl Hacker, MD 09/06/21 [phone]

## 2021-09-06 NOTE — ED Notes (Signed)
Lab to add on BNP.  °

## 2021-09-06 NOTE — ED Notes (Signed)
Cardiology at bedside.

## 2021-09-06 NOTE — ED Notes (Signed)
Provider and charge RN aware of trop.

## 2021-09-06 NOTE — Progress Notes (Signed)
Patient up to use the bathroom. States no chest pain. Rt Radial site remains stable. Will continue to monitor patient.

## 2021-09-06 NOTE — ED Notes (Signed)
Fan placed at patient's bedside per request.  Patient currently sleeping, respirations even and unlabored.

## 2021-09-06 NOTE — Progress Notes (Signed)
ANTICOAGULATION CONSULT NOTE - Initial Consult  Pharmacy Consult for Heparin  Indication: chest pain/ACS  Allergies  Allergen Reactions   Darvocet [Propoxyphene N-Acetaminophen] Hives   Sulfa Antibiotics     Patient Measurements: Height: 5\' 3"  (160 cm) Weight: 95.3 kg (210 lb) IBW/kg (Calculated) : 52.4  Vital Signs: Temp: 97.8 F (36.6 C) (01/31 0134) Temp Source: Oral (01/31 0134) BP: 143/78 (01/31 0530) Pulse Rate: 85 (01/31 0530)  Labs: Recent Labs    09/06/21 0149 09/06/21 0335 09/06/21 0510  HGB 15.2*  --   --   HCT 47.0*  --   --   PLT 320  --   --   APTT  --   --  69*  LABPROT  --   --  13.5  INR  --   --  1.0  CREATININE 0.87  --   --   TROPONINIHS 4,068* 5,257*  --     Estimated Creatinine Clearance: 69.9 mL/min (by C-G formula based on SCr of 0.87 mg/dL).   Medical History: History reviewed. No pertinent past medical history.   Assessment: 66 y/o F with chest pain and elevated troponin to start heparin per pharmacy. CBC/renal function good. PTA meds reviewed.   Goal of Therapy:  Heparin level 0.3-0.7 units/ml Monitor platelets by anticoagulation protocol: Yes   Plan:  Heparin 4000 units BOLUS Start heparin drip at 850 units/hr 1200 Heparin level Daily CBC/Heparin level Monitor for bleeding   Narda Bonds, PharmD, BCPS Clinical Pharmacist Phone: [phone]

## 2021-09-07 ENCOUNTER — Inpatient Hospital Stay (HOSPITAL_COMMUNITY): Payer: 59

## 2021-09-07 ENCOUNTER — Other Ambulatory Visit (HOSPITAL_COMMUNITY): Payer: Self-pay

## 2021-09-07 ENCOUNTER — Encounter (HOSPITAL_COMMUNITY): Payer: Self-pay | Admitting: Cardiology

## 2021-09-07 DIAGNOSIS — I214 Non-ST elevation (NSTEMI) myocardial infarction: Secondary | ICD-10-CM | POA: Diagnosis not present

## 2021-09-07 LAB — CBC
HCT: 42.5 % (ref 36.0–46.0)
Hemoglobin: 14.2 g/dL (ref 12.0–15.0)
MCH: 28.2 pg (ref 26.0–34.0)
MCHC: 33.4 g/dL (ref 30.0–36.0)
MCV: 84.5 fL (ref 80.0–100.0)
Platelets: 206 10*3/uL (ref 150–400)
RBC: 5.03 MIL/uL (ref 3.87–5.11)
RDW: 13.2 % (ref 11.5–15.5)
WBC: 8.4 10*3/uL (ref 4.0–10.5)
nRBC: 0 % (ref 0.0–0.2)

## 2021-09-07 LAB — BASIC METABOLIC PANEL
Anion gap: 9 (ref 5–15)
BUN: 13 mg/dL (ref 8–23)
CO2: 25 mmol/L (ref 22–32)
Calcium: 9 mg/dL (ref 8.9–10.3)
Chloride: 104 mmol/L (ref 98–111)
Creatinine, Ser: 0.88 mg/dL (ref 0.44–1.00)
GFR, Estimated: >60 mL/min (ref >60)
Glucose, Bld: 92 mg/dL (ref 70–99)
Potassium: 3.9 mmol/L (ref 3.5–5.1)
Sodium: 138 mmol/L (ref 135–145)

## 2021-09-07 LAB — ECHOCARDIOGRAM COMPLETE
Area-P 1/2: 4.68 cm2
Height: 63 in
S' Lateral: 3.3 cm
Weight: 3360 oz

## 2021-09-07 MED ORDER — METOPROLOL TARTRATE 25 MG PO TABS
12.5000 mg | ORAL_TABLET | Freq: Two times a day (BID) | ORAL | 3 refills | Status: DC
  Filled 2021-09-07: qty 90, 90d supply, fill #0

## 2021-09-07 MED ORDER — TICAGRELOR 90 MG PO TABS
90.0000 mg | ORAL_TABLET | Freq: Two times a day (BID) | ORAL | 3 refills | Status: DC
  Filled 2021-09-07: qty 180, 90d supply, fill #0

## 2021-09-07 MED ORDER — ROSUVASTATIN CALCIUM 40 MG PO TABS
40.0000 mg | ORAL_TABLET | Freq: Every day | ORAL | 3 refills | Status: DC
  Filled 2021-09-07: qty 30, 30d supply, fill #0

## 2021-09-07 MED ORDER — ASPIRIN 81 MG PO TBEC
81.0000 mg | DELAYED_RELEASE_TABLET | Freq: Every day | ORAL | 3 refills | Status: DC
  Filled 2021-09-07: qty 90, 90d supply, fill #0

## 2021-09-07 MED ORDER — NITROGLYCERIN 0.4 MG SL SUBL
0.4000 mg | SUBLINGUAL_TABLET | SUBLINGUAL | 4 refills | Status: DC | PRN
  Filled 2021-09-07: qty 25, 7d supply, fill #0

## 2021-09-07 NOTE — Progress Notes (Signed)
°  Transition of Care [facility]) Screening Note   Patient Details  Name: Miranda Little Date of Birth: 12/18/1955   Transition of Care [facility]) CM/SW Contact:    Milas Gain, [place] Phone Number: 09/07/2021, 2:15 PM    Transition of Care Department [facility]) has reviewed patient and no TOC needs have been identified at this time. We will continue to monitor patient advancement through interdisciplinary progression rounds. If new patient transition needs arise, please place a TOC consult.

## 2021-09-07 NOTE — Progress Notes (Signed)
CARDIAC REHAB PHASE I   PRE:  Rate/Rhythm: 85 SR    BP: sitting 104/60    SaO2: 95 RA  MODE:  Ambulation: 470 ft   POST:  Rate/Rhythm: 117 ST    BP: sitting 122/73     SaO2:   C/o SOB with ambulation. HR elevated and admits to SOB due to smoking. In depth education, esp regarding smoking. Discussed MI, stent, Brilinta importance, restrictions, smoking cessation, diet, exercise, NTG and CRPII. Pt receptive. She has been trying to quit smoking for 1 year without success. Willing to try a new method, either planned cigarettes with cutting down or nicotine patches. Will refer to [place] [facility] for virtual as she travels with her husband. [id number]   Harriet Masson CES, ACSM 09/07/2021 9:46 AM

## 2021-09-07 NOTE — TOC Benefit Eligibility Note (Signed)
Patient Product/process development scientist completed.    The patient is currently admitted and upon discharge could be taking Brilinta 90 mg.  The current 30 day co-pay is, $47.00.   The patient is insured through [employer]     Miranda Little, CPhT Pharmacy Patient Advocate Specialist [facility] Patient Advocate Team Direct Number: [phone]  Fax: [phone]

## 2021-09-07 NOTE — Discharge Summary (Signed)
Discharge Summary    Patient ID: Miranda Little MRN: [id number]; DOB: 1955/10/25  Admit date: 09/06/2021 Discharge date: 09/07/2021  PCP:  Eustaquio Maize, MD (Inactive)   [facility] Providers Cardiologist:  Peter Martinique, MD   {    Discharge Diagnoses    Principal Problem:   NSTEMI (non-ST elevated myocardial infarction) [facility]) Active Problems:   Tobacco abuse   Hypercholesterolemia   Acute CHF (congestive heart failure) ([place])   HTN (hypertension)   Coronary artery disease involving native coronary artery of native heart with unstable angina pectoris [facility])    Diagnostic Studies/Procedures    LHC 09/06/2021    Mid Cx to Dist Cx lesion is 99% stenosed with 99% stenosed side branch in LPAV.   Balloon angioplasty was performed on the sidebranch, using a BALLN SAPPHIRE 2.0X12.   Post intervention, the side branch was reduced to 20% residual stenosis -> following stent placement and main branch..   A drug-eluting stent was successfully placed in the main branch across the LPA V sidebranch - using a SYNERGY XD 2.50X16 -> deployed/postdilated at high ATM with stent balloon to 2.8 mm.   Post intervention, there is a 0% residual stenosis.   LV end diastolic pressure is low.   SUMMARY Severe bifurcation disease involving Distal LCx-OM2 and AV groove LCx 99% stenosis ->  Successful bifurcation PCI with DES of mid to distal LCx-OM2 (Synergy DES 2.5 mm x 18 mm -postdilated 2.8 mm) and  PTCA of AV groove LCx sidebranch (2.0 mm balloon) -> with only minimal 20% residual stenosis after jailing with stent. Otherwise normal coronary arteries with small Ramus Intermedius Initially borderline hypotension which resolved after 250 mL bolus.  LVEDP was only 6 mmHg.     RECOMMENDATIONS Patient has admission orders for overnight monitoring post PCI given NSTEMI. Continue aggressive risk factor modification based on GUIDELINE DIRECTED MEDICAL THERAPY => high-dose statin, beta-blocker,  ACE/ARB 2D echo pending Diagnostic Dominance: Right Intervention    Echo 09/06/2021  Formal read pending, preliminary review by Dr. Martinique prior to discharge  _____________   History of Present Illness     Miranda Little is a 66 y.o. female with longstanding tobacco abuse and obesity who was seen 09/06/2021 for the evaluation of NSTEMI.  Miranda Little reported that on Sunday 1/29 she was awoken from her sleep with severe central chest pressure with associated left arm numbness.  She had never experienced anything like this before.  She had company over so did not want to go to the ED and frighten everyone.  Over the next few days, her chest pressure recurred intermittently with associated shortness of breath.  She denied syncope, presyncope, swelling, PND, orthopnea, palpitations, nausea, abdominal pain, weakness, numbness, urinary symptoms.  She has no family history of cardiac illness.  She states that she has smoked a little less than a pack of cigarettes daily since she was in high school.  She denied illicit drug use and alcohol use.  She is a retired Pharmacist, hospital.  Eventually, she told her husband about her chest pain symptoms who then prompted her to come to the ED for evaluation on 1/31.    In the ED her VS were afebrile, HR 108, BP 173/96, RR 22, satting 90% on RA.  Labs notable for troponin of 4068.  EKG without ischemic changes.  CXR showed cardiomegaly with bilateral interstitial edema.  In the ED the patient was given heparin and aspirin loaded.  The patient is currently chest pain-free.  She  is being admitted to cardiology for an NSTEMI. Underwent LHC on 09/06/2021   Physical Exam Constitutional:      Appearance: She is well-developed.  Eyes:     Extraocular Movements: Extraocular movements intact.  Cardiovascular:     Rate and Rhythm: Normal rate and regular rhythm.     Heart sounds: Normal heart sounds.  Pulmonary:     Effort: Pulmonary effort is normal.     Breath sounds:  Examination of the right-lower field reveals rales. Examination of the left-lower field reveals rales. Rales present.  Musculoskeletal:     Cervical back: Normal range of motion.     Right lower leg: No edema.     Left lower leg: No edema.  Skin:    General: Skin is warm and dry.  Neurological:     General: No focal deficit present.     Mental Status: She is alert and oriented to person, place, and time.  Psychiatric:        Mood and Affect: Mood normal.        Behavior: Behavior normal.     Hospital Course     Consultants: None   NSTEMI  - Patient presented to the ED with elevated troponins (3875) and classic symptoms of ACS.  - EKG was without ischemic changes  - Underwent LHC on 09/06/2021 that showed severe bifurcation disease involving Distal LCx-OM2 and AV groove LCx 99% stenosis. Underwent successful bifurcation PCI with DES of mid to distal LCx-OM2 and PTCA of AV groove LCx side branch with only minimal 20% residual stenosis after jailing with stent. - Started DAPT with ASA, Brilinta - Started lopressor 12.5 mg BID, crestor 40 mg daily - Preliminary echo read by Dr. Martinique- normal EF, no need for lisinopril. Lisinopril started inpatient, will not continue on discharge   Tobacco abuse  -Patient counseled on tobacco use cessation this hospitalization    HLD  - LDL 173, HDL 43, triglycerides 75 on 1/31 - Started high intensity statin, will need LFTs and lipid panel in 8 weeks  Did the patient have an acute coronary syndrome (MI, NSTEMI, STEMI, etc) this admission?:  Yes                               AHA/ACC Clinical Performance & Quality Measures: Aspirin prescribed? - Yes ADP Receptor Inhibitor (Plavix/Clopidogrel, Brilinta/Ticagrelor or Effient/Prasugrel) prescribed (includes medically managed patients)? - Yes Beta Blocker prescribed? - Yes High Intensity Statin (Lipitor 40-12m or Crestor 20-43m prescribed? - Yes EF assessed during THIS hospitalization? - Yes For EF  <40%, was ACEI/ARB prescribed? - Not Applicable (EF >/= 4064%For EF <40%, Aldosterone Antagonist (Spironolactone or Eplerenone) prescribed? - Not Applicable (EF >/= 4033%Cardiac Rehab Phase II ordered (including medically managed patients)? - Yes       Patient has been scheduled for an outpatient follow up appointment in 2-3 weeks.  _____________  Discharge Vitals Blood pressure 109/64, pulse 77, temperature 98.1 F (36.7 C), temperature source Oral, resp. rate 17, height _0  (1.6 m), weight 95.3 kg, SpO2 94 %.  Filed Weights   09/06/21 0427  Weight: 95.3 kg    Labs & Radiologic Studies    CBC Recent Labs    09/06/21 0149 09/07/21 0332  WBC 9.3 8.4  NEUTROABS 6.6  --   HGB 15.2* 14.2  HCT 47.0* 42.5  MCV 85.6 84.5  PLT 320 20295 Basic Metabolic Panel Recent Labs  09/06/21 0149 09/07/21 0332  NA 138 138  K 3.7 3.9  CL 104 104  CO2 25 25  GLUCOSE 129* 92  BUN 15 13  CREATININE 0.87 0.88  CALCIUM 9.3 9.0   Liver Function Tests Recent Labs    09/06/21 0149  AST 53*  ALT 23  ALKPHOS 99  BILITOT 0.6  PROT 7.6  ALBUMIN 3.8   No results for input(s): LIPASE, AMYLASE in the last 72 hours. High Sensitivity Troponin:   Recent Labs  Lab 09/06/21 0149 09/06/21 0335  TROPONINIHS 4,068* 5,257*    BNP Invalid input(s): POCBNP D-Dimer No results for input(s): DDIMER in the last 72 hours. Hemoglobin A1C Recent Labs    09/06/21 0510  HGBA1C 5.5   Fasting Lipid Panel Recent Labs    09/06/21 0510  CHOL 231*  HDL 43  LDLCALC 173*  TRIG 75  CHOLHDL 5.4   Thyroid Function Tests Recent Labs    09/06/21 0510  TSH 2.521   _____________  DG Chest 2 View  Result Date: 09/06/2021 CLINICAL DATA:  Chest pain for 2 days, initial encounter EXAM: CHEST - 2 VIEW COMPARISON:  03/18/2015 FINDINGS: Check shadow is stable. Mild central vascular congestion is noted with interstitial edema. No sizable effusion is noted. No bony abnormality is seen. IMPRESSION:  Mild changes of CHF. Electronically Signed   By: Inez Catalina M.D.   On: 09/06/2021 02:38   CARDIAC CATHETERIZATION  Result Date: 09/06/2021   Mid Cx to Dist Cx lesion is 99% stenosed with 99% stenosed side branch in LPAV.   Balloon angioplasty was performed on the sidebranch, using a BALLN SAPPHIRE 2.0X12.   Post intervention, the side branch was reduced to 20% residual stenosis -> following stent placement and main branch..   A drug-eluting stent was successfully placed in the main branch across the LPA V sidebranch - using a SYNERGY XD 2.50X16 -> deployed/postdilated at high ATM with stent balloon to 2.8 mm.   Post intervention, there is a 0% residual stenosis.   LV end diastolic pressure is low. SUMMARY Severe bifurcation disease involving Distal LCx-OM2 and AV groove LCx 99% stenosis -> Successful bifurcation PCI with DES of mid to distal LCx-OM2 (Synergy DES 2.5 mm x 18 mm -postdilated 2.8 mm) and PTCA of AV groove LCx sidebranch (2.0 mm balloon) -> with only minimal 20% residual stenosis after jailing with stent. Otherwise normal coronary arteries with small Ramus Intermedius Initially borderline hypotension which resolved after 250 mL bolus.  LVEDP was only 6 mmHg. RECOMMENDATIONS Patient has admission orders for overnight monitoring post PCI given NSTEMI. Continue aggressive risk factor modification based on GUIDELINE DIRECTED MEDICAL THERAPY => high-dose statin, beta-blocker, ACE/ARB 2D echo pending Glenetta Hew, MD  Disposition   Pt is being discharged home today in good condition.  Follow-up Plans & Appointments     Follow-up Information     Deberah Pelton, NP Follow up on 09/22/2021.   Specialty: Cardiology Why: Appointment at 8:25 AM Contact information: [address] [place] [address] [phone]                Discharge Instructions     Amb Referral to Cardiac Rehabilitation   Complete by: As directed    virtual   Diagnosis:  Coronary  Stents PTCA NSTEMI     After initial evaluation and assessments completed: Virtual Based Care may be provided alone or in conjunction with Phase 2 Cardiac Rehab based on patient barriers.: Yes   Diet -  low sodium heart healthy   Complete by: As directed    Discharge instructions   Complete by: As directed    PLEASE DO NOT [place]!!!!! Also keep a log of you blood pressures and bring back to your follow up appt. Please call the office with any questions.   Patients taking blood thinners should generally stay away from medicines like ibuprofen, Advil, Motrin, naproxen, and Aleve due to risk of stomach bleeding. You may take Tylenol as directed or talk to your primary doctor about alternatives.  PLEASE ENSURE THAT YOU DO NOT RUN OUT OF YOUR BRILINTA. This medication is very important to remain on for at least one year. IF you have issues obtaining this medication due to cost please CALL the office 3-5 business days prior to running out in order to prevent missing doses of this medication.  Radial Site Care Refer to this sheet in the next few weeks. These instructions provide you with information on caring for yourself after your procedure. Your caregiver may also give you more specific instructions. Your treatment has been planned according to current medical practices, but problems sometimes occur. Call your caregiver if you have any problems or questions after your procedure. HOME CARE INSTRUCTIONS You may shower the day after the procedure. Remove the bandage (dressing) and gently wash the site with plain soap and water. Gently pat the site dry.  Do not apply powder or lotion to the site.  Do not submerge the affected site in water for 3 to 5 days.  Inspect the site at least twice daily.  Do not flex or bend the affected arm for 24 hours.  No lifting over 5 pounds (2.3 kg) for 5 days after your procedure.  Do not drive home if you are discharged the same day of the  procedure. Have someone else drive you.  You may drive 24 hours after the procedure unless otherwise instructed by your caregiver.  What to expect: Any bruising will usually fade within 1 to 2 weeks.  Blood that collects in the tissue (hematoma) may be painful to the touch. It should usually decrease in size and tenderness within 1 to 2 weeks.  SEEK IMMEDIATE MEDICAL CARE IF: You have unusual pain at the radial site.  You have redness, warmth, swelling, or pain at the radial site.  You have drainage (other than a small amount of blood on the dressing).  You have chills.  You have a fever or persistent symptoms for more than 72 hours.  You have a fever and your symptoms suddenly get worse.  Your arm becomes pale, cool, tingly, or numb.  You have heavy bleeding from the site. Hold pressure on the site.   Increase activity slowly   Complete by: As directed        Discharge Medications   Allergies as of 09/07/2021       Reactions   Darvocet [propoxyphene N-acetaminophen] Hives   Sulfa Antibiotics         Medication List     STOP taking these medications    amoxicillin 875 MG tablet Commonly known as: AMOXIL   clindamycin 300 MG capsule Commonly known as: Cleocin   fluconazole 150 MG tablet Commonly known as: DIFLUCAN       TAKE these medications    acetaminophen 325 MG tablet Commonly known as: TYLENOL Take 650 mg by mouth every 6 (six) hours as needed for mild pain.   ALPRAZolam 0.25 MG tablet Commonly  known as: XANAX Take 0.125 mg by mouth every 8 (eight) hours as needed for sleep.   aspirin 81 MG EC tablet Take 1 tablet (81 mg total) by mouth daily. Swallow whole. Start taking on: September 08, 2021   azelastine 0.1 % nasal spray Commonly known as: ASTELIN Place 1 spray into both nostrils daily.   fexofenadine 180 MG tablet Commonly known as: ALLEGRA Take 180 mg by mouth daily as needed for allergies or rhinitis.   fluticasone 50 MCG/ACT nasal  spray Commonly known as: FLONASE Place 1 spray into both nostrils every morning.   ibuprofen 200 MG tablet Commonly known as: ADVIL Take 200 mg by mouth every 6 (six) hours as needed for moderate pain.   metoprolol tartrate 25 MG tablet Commonly known as: LOPRESSOR Take 1/2 tablet (12.5 mg total) by mouth 2 (two) times daily.   nitroGLYCERIN 0.4 MG SL tablet Commonly known as: NITROSTAT Place 1 tablet (0.4 mg total) under the tongue every 5 (five) minutes x 3 doses as needed for chest pain.   rosuvastatin 40 MG tablet Commonly known as: CRESTOR Take 1 tablet (40 mg total) by mouth daily. Start taking on: September 08, 2021   ticagrelor 90 MG Tabs tablet Commonly known as: BRILINTA Take 1 tablet (90 mg total) by mouth 2 (two) times daily.           Outstanding Labs/Studies   LFTs, lipid panel in 8 weeks   Duration of Discharge Encounter   Greater than 30 minutes including physician time.  Signed, Margie Billet, PA-C 09/07/2021, 4:29 PM

## 2021-09-07 NOTE — Progress Notes (Signed)
°  Echocardiogram 2D Echocardiogram has been performed.  Miranda Little 09/07/2021, 4:22 PM

## 2021-09-07 NOTE — Research (Signed)
V-Inception Research Study  Patient Contacted about potential participation in [employer] V-Inception.  Study was discussed with the patient and the opportunity to ask questions was given.  Patient was given a paper copy of consent.  Patient will be contacted in 5-7 days for Follow-up phone call about information on the study.  The opportunity to ask questions was given to the patient.   This patient is eligible to participate in V-Inception Study.  The study is comparing the initiation of Inclisiran on top of usual care in Patient's with a recent acute coronary syndrome within the past 5 weeks.  Eligibility Criteria: recent ACS within 5 weeks, Serum LCL-C greater than or equal to 70mg /dL or non-HDL-C greater than or equal to 100mg /dL, and taking statin therapy or statin intolerant (not on PCSK9 Inhibitors).  Its a randomized, controlled, multicenter, open-label trial comparing a hospital post-discharge care pathway involving aggressive LCL-C management that includes Inclisiran with usual care versus usual care alone in patients with a recent ACS.   , RN BSN [facility] Cardiovascular Research & Education Direct Line: [phone]

## 2021-09-08 MED FILL — Nitroglycerin IV Soln 100 MCG/ML in D5W: INTRA_ARTERIAL | Qty: 10 | Status: AC

## 2021-09-12 ENCOUNTER — Telehealth (HOSPITAL_COMMUNITY): Payer: Self-pay

## 2021-09-12 NOTE — Telephone Encounter (Signed)
Called and spoke with pt in regards to CR, pt stated she would like to attend CR at [facility]. Will send referral to [facility] for pt.

## 2021-09-13 ENCOUNTER — Encounter: Payer: Self-pay | Admitting: *Deleted

## 2021-09-13 DIAGNOSIS — Z006 Encounter for examination for normal comparison and control in clinical research program: Secondary | ICD-10-CM

## 2021-09-13 NOTE — Research (Signed)
Follow-up phone call to patient about participating in V-Inception research study. Left message for patient to return call

## 2021-09-21 NOTE — Progress Notes (Signed)
Cardiology Clinic Note   Patient Name: Miranda Little Date of Encounter: 09/22/2021  Primary Care Provider:  Glenis Smoker, MD Primary Cardiologist:  Peter Martinique, MD  Patient Profile     Miranda Little 66 year old female presents to the clinic today for follow-up evaluation post NSTEMI  Past Medical History    History reviewed. No pertinent past medical history. Past Surgical History:  Procedure Laterality Date   CARPAL TUNNEL RELEASE     CESAREAN SECTION     CHOLECYSTECTOMY N/A 03/18/2015   Procedure: LAPAROSCOPIC CHOLECYSTECTOMY WITH INTRAOPERATIVE CHOLANGIOGRAM;  Surgeon: Erroll Luna, MD;  Location: [facility];  Service: General;  Laterality: N/A;   CYST REMOVAL NECK     ERCP N/A 03/19/2015   Procedure: ENDOSCOPIC RETROGRADE CHOLANGIOPANCREATOGRAPHY (ERCP);  Surgeon: Clarene Essex, MD;  Location: [facility];  Service: Endoscopy;  Laterality: N/A;   KNEE SURGERY Right    LEFT HEART CATH AND CORONARY ANGIOGRAPHY N/A 09/06/2021   Procedure: LEFT HEART CATH AND CORONARY ANGIOGRAPHY;  Surgeon: Leonie Man, MD;  Location: [facility];  Service: Cardiovascular;  Laterality: N/A;    Allergies  Allergies  Allergen Reactions   Darvocet [Propoxyphene N-Acetaminophen] Hives   Sulfa Antibiotics     History of Present Illness    Miranda Little has a PMH of acute CHF, HTN, hyperlipidemia, tobacco abuse, and coronary artery disease.  She noted severe chest pain/pressure 09/04/2021.  This was associated with left arm numbness.  This episode woke her from her sleep.  She had never experienced anything like the episode before.  She reported that she had company visiting she did not want to go to the ED or frighten anyone.  She noted intermittent episodes of recurrent chest discomfort associated with shortness of breath over the next few days.  She denied syncope, presyncope, lower extremity swelling, PND, orthopnea, palpitations, nausea, abdominal pain, weakness, or  urinary symptoms.  She denied family history of cardiac issues.  She admitted to smoking less than a pack a day since she was in high school.  She denied illicit drug use and alcohol use.  She is a retired Pharmacist, hospital.  She eventually told her husband about her chest symptoms.  He brought her to the emergency department 09/06/2021.  In the emergency department she was noted to have blood pressure 173/96, elevated troponins 4068, EKG showed no ischemic changes.  Chest x-ray showed cardiomegaly and bilateral interstitial edema.  She was started on heparin and given aspirin in the emergency department.  She underwent cardiac catheterization 09/06/2021.  She was noted to have mid-distal circumflex lesion 99% stenosed with a 99% stenosed branch in the L PDA V.  She received PCI with DES x1 to her circumflex sidebranch.  She was placed on aspirin and Brilinta.  She was started on Lopressor 12.5 twice daily, and rosuvastatin 40 mg daily.  Her echocardiogram 09/07/2021 showed an LVEF of 55-60%, G1 DD, trivial mitral valve regurgitation and no other significant valvular abnormalities.  She presents to the clinic today for follow-up evaluation and states she has been fairly anxious since having her stent placed.  We reviewed her angiography and her echocardiogram.  She reports that she has cut her smoking in half and is now only smoking half pack per day.  She is using the nicotine patch.  She has been tolerating her medications well and denies side effects.  She has been slowly increasing her physical activity.  I will give her the salty 6 diet sheet,  order fasting lipids and LFTs in 4-6 weeks, continue her current medication regimen, and plan follow-up for 3 to 4 months.  Today she denies chest pain, shortness of breath, lower extremity edema, fatigue, palpitations, melena, hematuria, hemoptysis, diaphoresis, weakness, presyncope, syncope, orthopnea, and PND.   Home Medications    Prior to Admission medications    Medication Sig Start Date End Date Taking? Authorizing Provider  acetaminophen (TYLENOL) 325 MG tablet Take 650 mg by mouth every 6 (six) hours as needed for mild pain.    [provider]  ALPRAZolam (XANAX) 0.25 MG tablet Take 0.125 mg by mouth every 8 (eight) hours as needed for sleep. 06/08/21   [provider]  aspirin 81 MG EC tablet Take 1 tablet (81 mg total) by mouth daily. Swallow whole. 09/08/21   Margie Billet, PA-C  azelastine (ASTELIN) 0.1 % nasal spray Place 1 spray into both nostrils daily. 06/08/21   [provider]  fexofenadine (ALLEGRA) 180 MG tablet Take 180 mg by mouth daily as needed for allergies or rhinitis.    [provider]  fluticasone (FLONASE) 50 MCG/ACT nasal spray Place 1 spray into both nostrils every morning. 06/08/21   [provider]  ibuprofen (ADVIL,MOTRIN) 200 MG tablet Take 200 mg by mouth every 6 (six) hours as needed for moderate pain.    [provider]  metoprolol tartrate (LOPRESSOR) 25 MG tablet Take 1/2 tablet (12.5 mg total) by mouth 2 (two) times daily. 09/07/21   Margie Billet, PA-C  nitroGLYCERIN (NITROSTAT) 0.4 MG SL tablet Place 1 tablet (0.4 mg total) under the tongue every 5 (five) minutes x 3 doses as needed for chest pain. 09/07/21   Margie Billet, PA-C  rosuvastatin (CRESTOR) 40 MG tablet Take 1 tablet (40 mg total) by mouth daily. 09/08/21   Margie Billet, PA-C  ticagrelor (BRILINTA) 90 MG TABS tablet Take 1 tablet (90 mg total) by mouth 2 (two) times daily. 09/07/21   Margie Billet, PA-C    Family History    Family History  Problem Relation Age of Onset   Tuberculosis Mother    Cancer Father        colon   Stroke Father    Hypertension Sister    She indicated that her mother is deceased. She indicated that her father is deceased. She indicated that her sister is alive.  Social History    Social History   Socioeconomic History   Marital status: Single     Spouse name: Not on file   Number of children: Not on file   Years of education: Not on file   Highest education level: Not on file  Occupational History   Not on file  Tobacco Use   Smoking status: Every Day    Packs/day: 0.50    Years: 35.00    Pack years: 17.50    Types: Cigarettes   Smokeless tobacco: Never  Substance and Sexual Activity   Alcohol use: Yes    Comment: occasionally   Drug use: No   Sexual activity: Not on file  Other Topics Concern   Not on file  Social History Narrative   Not on file   Social Determinants of Health   Financial Resource Strain: Not on file  Food Insecurity: Not on file  Transportation Needs: Not on file  Physical Activity: Not on file  Stress: Not on file  Social Connections: Not on file  Intimate Partner Violence: Not on file  Review of Systems    General:  No chills, fever, night sweats or weight changes.  Cardiovascular:  No chest pain, dyspnea on exertion, edema, orthopnea, palpitations, paroxysmal nocturnal dyspnea. Dermatological: No rash, lesions/masses Respiratory: No cough, dyspnea Urologic: No hematuria, dysuria Abdominal:   No nausea, vomiting, diarrhea, bright red blood per rectum, melena, or hematemesis Neurologic:  No visual changes, wkns, changes in mental status. All other systems reviewed and are otherwise negative except as noted above.  Physical Exam    VS:  BP 138/74    Pulse 87    Ht 5' 3"  (1.6 m)    Wt 205 lb 6.4 oz (93.2 kg)    SpO2 95%    BMI 36.38 kg/m  , BMI Body mass index is 36.38 kg/m. GEN: Well nourished, well developed, in no acute distress. HEENT: normal. Neck: Supple, no JVD, carotid bruits, or masses. Cardiac: RRR, no murmurs, rubs, or gallops. No clubbing, cyanosis, edema.  Radials/DP/PT 2+ and equal bilaterally.  Respiratory:  Respirations regular and unlabored, clear to auscultation bilaterally. GI: Soft, nontender, nondistended, BS + x 4. MS: no deformity or atrophy. Skin: warm and  dry, no rash.  Right radial cardiac cath site clean dry intact no signs of infection Neuro:  Strength and sensation are intact. Psych: Normal affect.  Accessory Clinical Findings    Recent Labs: 09/06/2021: ALT 23; B Natriuretic Peptide 150.2; TSH 2.521 09/07/2021: BUN 13; Creatinine, Ser 0.88; Hemoglobin 14.2; Platelets 206; Potassium 3.9; Sodium 138   Recent Lipid Panel    Component Value Date/Time   CHOL 231 (H) 09/06/2021 0510   TRIG 75 09/06/2021 0510   HDL 43 09/06/2021 0510   CHOLHDL 5.4 09/06/2021 0510   VLDL 15 09/06/2021 0510   LDLCALC 173 (H) 09/06/2021 0510    ECG personally reviewed by me today-normal sinus rhythm incomplete right bundle branch block left anterior fascicular block nonspecific ST abnormality 87 bpm- No acute changes  Echocardiogram 09/07/2021 IMPRESSIONS     1. Left ventricular ejection fraction, by estimation, is 55 to 60%. The  left ventricle has normal function. The left ventricle has no regional  wall motion abnormalities. Left ventricular diastolic parameters are  consistent with Grade I diastolic  dysfunction (impaired relaxation).   2. Right ventricular systolic function is normal. The right ventricular  size is normal. Tricuspid regurgitation signal is inadequate for assessing  PA pressure.   3. The mitral valve is normal in structure. Trivial mitral valve  regurgitation. No evidence of mitral stenosis.   4. The aortic valve is tricuspid. Aortic valve regurgitation is trivial.  No aortic stenosis is present.   5. The inferior vena cava is normal in size with greater than 50%  respiratory variability, suggesting right atrial pressure of 3 mmHg.  Cardiac catheterization 09/06/2021   Mid Cx to Dist Cx lesion is 99% stenosed with 99% stenosed side branch in LPAV.   Balloon angioplasty was performed on the sidebranch, using a BALLN SAPPHIRE 2.0X12.   Post intervention, the side branch was reduced to 20% residual stenosis -> following stent  placement and main branch..   A drug-eluting stent was successfully placed in the main branch across the LPA V sidebranch - using a SYNERGY XD 2.50X16 -> deployed/postdilated at high ATM with stent balloon to 2.8 mm.   Post intervention, there is a 0% residual stenosis.   LV end diastolic pressure is low.   SUMMARY Severe bifurcation disease involving Distal LCx-OM2 and AV groove LCx 99% stenosis ->  Successful  bifurcation PCI with DES of mid to distal LCx-OM2 (Synergy DES 2.5 mm x 18 mm -postdilated 2.8 mm) and  PTCA of AV groove LCx sidebranch (2.0 mm balloon) -> with only minimal 20% residual stenosis after jailing with stent. Otherwise normal coronary arteries with small Ramus Intermedius Initially borderline hypotension which resolved after 250 mL bolus.  LVEDP was only 6 mmHg.     RECOMMENDATIONS Patient has admission orders for overnight monitoring post PCI given NSTEMI. Continue aggressive risk factor modification based on GUIDELINE DIRECTED MEDICAL THERAPY => high-dose statin, beta-blocker, ACE/ARB 2D echo pending       Glenetta Hew, MD  Diagnostic Dominance: Right Intervention    Assessment & Plan   1.  NSTEMI-denies episodes of arm neck back or chest discomfort since being discharged from the hospital.  Has noted occasional heartburn which she had prior to catheterization.  Underwent cardiac catheterization 09/06/2021 and was noted to have mid-distal circumflex 99% stenosis along with LPDA V stenosis.  She received PCI with DES x1 to her circumflex sidebranch Continue aspirin, Brilinta, metoprolol, rosuvastatin Heart healthy low-sodium diet-salty 6 given Increase physical activity as tolerated  Hyperlipidemia-09/06/2021: Cholesterol 231; HDL 43; LDL Cholesterol 173; Triglycerides 75; VLDL 15 Continue aspirin, rosuvastatin Heart healthy low-sodium high-fiber diet Repeat fasting lipids and LFTs in 4 to 6 weeks  Tobacco abuse-continues to work on cutting back on  smoking.  Is now down to 1/2 pack per day.  Continue nicotine patch Smoking cessation recommended Smoking cessation information given  Disposition: Follow-up with Dr. Martinique or me in 3-4 months.  Jossie Ng. Lenea Bywater NP-C    09/22/2021, 9:53 AM [place] [address] Office [phone] Fax [phone]  Notice: This dictation was prepared with Dragon dictation along with smaller phrase technology. Any transcriptional errors that result from this process are unintentional and may not be corrected upon review.  I spent 15 minutes examining this patient, reviewing medications, and using patient centered shared decision making involving her cardiac care.  Prior to her visit I spent greater than 20 minutes reviewing her past medical history,  medications, and prior cardiac tests.

## 2021-09-22 ENCOUNTER — Other Ambulatory Visit: Payer: Self-pay

## 2021-09-22 ENCOUNTER — Ambulatory Visit (INDEPENDENT_AMBULATORY_CARE_PROVIDER_SITE_OTHER): Payer: 59 | Admitting: General Practice

## 2021-09-22 ENCOUNTER — Encounter: Payer: Self-pay | Admitting: General Practice

## 2021-09-22 VITALS — BP 138/74 | HR 87 | Ht 63.0 in | Wt 205.4 lb

## 2021-09-22 DIAGNOSIS — Z79899 Other long term (current) drug therapy: Secondary | ICD-10-CM

## 2021-09-22 DIAGNOSIS — E782 Mixed hyperlipidemia: Secondary | ICD-10-CM

## 2021-09-22 DIAGNOSIS — I214 Non-ST elevation (NSTEMI) myocardial infarction: Secondary | ICD-10-CM | POA: Diagnosis not present

## 2021-09-22 DIAGNOSIS — Z720 Tobacco use: Secondary | ICD-10-CM | POA: Diagnosis not present

## 2021-09-22 NOTE — Patient Instructions (Signed)
Medication Instructions:  The current medical regimen is effective;  continue present plan and medications as directed. Please refer to the Current Medication list given to you today.   *If you need a refill on your cardiac medications before your next appointment, please call your pharmacy*  Lab Work: FASTING LIPID AND LFT IN 4 WEEKS (~10-20-21) If you have labs (blood work) drawn today and your tests are completely normal, you will receive your results only by:  [place] (if you have MyChart) OR A paper copy in the mail.  If you have any lab test that is abnormal or we need to change your treatment, we will call you to review the results. You may go to any [facility] that is convenient for you however, we do have a lab in our office that is able to assist you. You DO NOT need an appointment for our lab. The lab is open 8:00am and closes at 4:00pm. Lunch 12:45 - 1:45pm.  Special Instructions PLEASE READ AND FOLLOW SALTY 6-ATTACHED-1,800 mg daily  PLEASE INCREASE PHYSICAL ACTIVITY AS TOLERATED  PLEASE READ AND FOLLOW CESSATION TIPS-ATTACHED ALSO SEE HANDOUTS GIVEN TO YOU  Follow-Up: Your next appointment:  3-4 month(s) In Person with Peter Martinique, MD     At [facility], you and your health needs are our priority.  As part of our continuing mission to provide you with exceptional heart care, we have created designated Provider Care Teams.  These Care Teams include your primary Cardiologist (physician) and Advanced Practice Providers (APPs -  Physician Assistants and Nurse Practitioners) who all work together to provide you with the care you need, when you need it.         Steps to Quit Smoking Smoking tobacco is the leading cause of preventable death. It can affect almost every organ in the body. Smoking puts you and people around you at risk for many serious, long-lasting (chronic) diseases. Quitting smoking can be hard, but it is one of the best things that you can do for your  health. It is never too late to quit. How do I get ready to quit? When you decide to quit smoking, make a plan to help you succeed. Before you quit: Pick a date to quit. Set a date within the next 2 weeks to give you time to prepare. Write down the reasons why you are quitting. Keep this list in places where you will see it often. Tell your family, friends, and co-workers that you are quitting. Their support is important. Talk with your doctor about the choices that may help you quit. Find out if your health insurance will pay for these treatments. Know the people, places, things, and activities that make you want to smoke (triggers). Avoid them. What first steps can I take to quit smoking? Throw away all cigarettes at home, at work, and in your car. Throw away the things that you use when you smoke, such as ashtrays and lighters. Clean your car. Make sure to empty the ashtray. Clean your home, including curtains and carpets. What can I do to help me quit smoking? Talk with your doctor about taking medicines and seeing a counselor at the same time. You are more likely to succeed when you do both. If you are pregnant or breastfeeding, talk with your doctor about counseling or other ways to quit smoking. Do not take medicine to help you quit smoking unless your doctor tells you to do so. To quit smoking: Quit right away Quit smoking  totally, instead of slowly cutting back on how much you smoke over a period of time. Go to counseling. You are more likely to quit if you go to counseling sessions regularly. Take medicine You may take medicines to help you quit. Some medicines need a prescription, and some you can buy over-the-counter. Some medicines may contain a drug called nicotine to replace the nicotine in cigarettes. Medicines may: Help you to stop having the desire to smoke (cravings). Help to stop the problems that come when you stop smoking (withdrawal symptoms). Your doctor may ask you  to use: Nicotine patches, gum, or lozenges. Nicotine inhalers or sprays. Non-nicotine medicine that is taken by mouth. Find resources Find resources and other ways to help you quit smoking and remain smoke-free after you quit. These resources are most helpful when you use them often. They include: Online chats with a Social worker. Phone quitlines. Printed Furniture conservator/restorer. Support groups or group counseling. Text messaging programs. Mobile phone apps. Use apps on your mobile phone or tablet that can help you stick to your quit plan. There are many free apps for mobile phones and tablets as well as websites. Examples include Quit Guide from the [employer] and smokefree.gov  What things can I do to make it easier to quit?  Talk to your family and friends. Ask them to support and encourage you. Call a phone quitline (1-800-QUIT-NOW), reach out to support groups, or work with a Social worker. Ask people who smoke to not smoke around you. Avoid places that make you want to smoke, such as: Bars. Parties. Smoke-break areas at work. Spend time with people who do not smoke. Lower the stress in your life. Stress can make you want to smoke. Try these things to help your stress: Getting regular exercise. Doing deep-breathing exercises. Doing yoga. Meditating. Doing a body scan. To do this, close your eyes, focus on one area of your body at a time from head to toe. Notice which parts of your body are tense. Try to relax the muscles in those areas. How will I feel when I quit smoking? Day 1 to 3 weeks Within the first 24 hours, you may start to have some problems that come from quitting tobacco. These problems are very bad 2-3 days after you quit, but they do not often last for more than 2-3 weeks. You may get these symptoms: Mood swings. Feeling restless, nervous, angry, or annoyed. Trouble concentrating. Dizziness. Strong desire for high-sugar foods and nicotine. Weight gain. Trouble pooping  (constipation). Feeling like you may vomit (nausea). Coughing or a sore throat. Changes in how the medicines that you take for other issues work in your body. Depression. Trouble sleeping (insomnia). Week 3 and afterward After the first 2-3 weeks of quitting, you may start to notice more positive results, such as: Better sense of smell and taste. Less coughing and sore throat. Slower heart rate. Lower blood pressure. Clearer skin. Better breathing. Fewer sick days. Quitting smoking can be hard. Do not give up if you fail the first time. Some people need to try a few times before they succeed. Do your best to stick to your quit plan, and talk with your doctor if you have any questions or concerns. Summary Smoking tobacco is the leading cause of preventable death. Quitting smoking can be hard, but it is one of the best things that you can do for your health. When you decide to quit smoking, make a plan to help you succeed. Quit smoking right away, not  slowly over a period of time. When you start quitting, seek help from your doctor, family, or friends. This information is not intended to replace advice given to you by your health care provider. Make sure you discuss any questions you have with your health care provider. Document Revised: 04/01/2021 Document Reviewed: 10/12/2018 Elsevier Patient Education  [place].

## 2021-09-26 ENCOUNTER — Other Ambulatory Visit (HOSPITAL_COMMUNITY): Payer: Self-pay

## 2021-10-20 ENCOUNTER — Ambulatory Visit (HOSPITAL_COMMUNITY)
Admission: EM | Admit: 2021-10-20 | Discharge: 2021-10-20 | Disposition: A | Discharge status: Home / Self Care | Payer: 59 | Attending: Nurse Practitioner | Admitting: Nurse Practitioner

## 2021-10-20 ENCOUNTER — Other Ambulatory Visit: Payer: Self-pay

## 2021-10-20 ENCOUNTER — Encounter (HOSPITAL_COMMUNITY): Payer: Self-pay | Admitting: Emergency Medicine

## 2021-10-20 DIAGNOSIS — L0201 Cutaneous abscess of face: Secondary | ICD-10-CM

## 2021-10-20 MED ORDER — DOXYCYCLINE HYCLATE 100 MG PO CAPS: 100.0000 mg | ORAL_CAPSULE | Freq: Two times a day (BID) | ORAL | 0 refills | Status: AC

## 2021-10-20 NOTE — ED Provider Notes (Signed)
?[facility] ? ? ? ?CSN: [id number] ?Arrival date & time: 10/20/21  [id number] ? ? ?  ? ?History   ?Chief Complaint ?Chief Complaint  ?Patient presents with  ? Facial Cyst  ? ? ?HPI ?Miranda Little is a 66 y.o. female.  ? ?Patient reports 1 day history of facial abscess.  Reports last night, she noticed a raised area on her right cheek getting larger, red, and becoming more painful.  She tried warm compresses without relief.  She denies any oozing or drainage from the area, fevers, body aches, chills, nausea, and vomiting.   ? ? ?History reviewed. No pertinent past medical history. ? ?Patient Active Problem List  ? Diagnosis Date Noted  ? NSTEMI (non-ST elevated myocardial infarction) (HCC) 09/06/2021  ? Hypercholesterolemia 09/06/2021  ? Acute CHF (congestive heart failure) (HCC) 09/06/2021  ? HTN (hypertension) 09/06/2021  ? Coronary artery disease involving native coronary artery of native heart with unstable angina pectoris (HCC)   ? Tobacco abuse 01/31/2016  ? Cholecystitis 03/17/2015  ? ? ?Past Surgical History:  ?Procedure Laterality Date  ? CARPAL TUNNEL RELEASE    ? CESAREAN SECTION    ? CHOLECYSTECTOMY N/A 03/18/2015  ? Procedure: LAPAROSCOPIC CHOLECYSTECTOMY WITH INTRAOPERATIVE CHOLANGIOGRAM;  Surgeon: Harriette Bouillonhomas Cornett, MD;  Location: [facility];  Service: General;  Laterality: N/A;  ? CYST REMOVAL NECK    ? ERCP N/A 03/19/2015  ? Procedure: ENDOSCOPIC RETROGRADE CHOLANGIOPANCREATOGRAPHY (ERCP);  Surgeon: Vida RiggerMarc Magod, MD;  [facility];  Service: Endoscopy;  Laterality: N/A;  ? KNEE SURGERY Right   ? LEFT HEART CATH AND CORONARY ANGIOGRAPHY N/A 09/06/2021  ? Procedure: LEFT HEART CATH AND CORONARY ANGIOGRAPHY;  Surgeon: Marykay LexHarding, David W, MD;  [facility];  Service: Cardiovascular;  Laterality: N/A;  ? ? ?OB History   ?No obstetric history on file. ?  ? ? ? ?Home Medications   ? ?Prior to Admission medications   ?Medication Sig Start Date End Date Taking? Authorizing Provider   ?doxycycline (VIBRAMYCIN) 100 MG capsule Take 1 capsule (100 mg total) by mouth 2 (two) times daily for 7 days. 10/20/21 10/27/21 Yes Valentino NoseMartinez, Cheridan Kibler A, NP  ?acetaminophen (TYLENOL) 325 MG tablet Take 650 mg by mouth every 6 (six) hours as needed for mild pain.    [provider]  ?ALPRAZolam (XANAX) 0.25 MG tablet Take 0.125 mg by mouth every 8 (eight) hours as needed for sleep. 06/08/21   [provider]  ?aspirin 81 MG EC tablet Take 1 tablet (81 mg total) by mouth daily. Swallow whole. 09/08/21   Jonita AlbeeJohnson, Kathleen R, PA-C  ?azelastine (ASTELIN) 0.1 % nasal spray Place 1 spray into both nostrils daily. 06/08/21   [provider]  ?fexofenadine (ALLEGRA) 180 MG tablet Take 180 mg by mouth daily as needed for allergies or rhinitis.    [provider]  ?fluticasone (FLONASE) 50 MCG/ACT nasal spray Place 1 spray into both nostrils every morning. 06/08/21   [provider]  ?ibuprofen (ADVIL,MOTRIN) 200 MG tablet Take 200 mg by mouth every 6 (six) hours as needed for moderate pain. ?Patient not taking: Reported on 09/22/2021    [provider]  ?metoprolol tartrate (LOPRESSOR) 25 MG tablet Take 1/2 tablet (12.5 mg total) by mouth 2 (two) times daily. 09/07/21   Jonita AlbeeJohnson, Kathleen R, PA-C  ?nitroGLYCERIN (NITROSTAT) 0.4 MG SL tablet Place 1 tablet (0.4 mg total) under the tongue every 5 (five) minutes x 3 doses as needed for chest pain. 09/07/21   Laural BenesJohnson,  Leeroy Bock, PA-C  ?rosuvastatin (CRESTOR) 40 MG tablet Take 1 tablet (40 mg total) by mouth daily. 09/08/21   Jonita Albee, PA-C  ?ticagrelor (BRILINTA) 90 MG TABS tablet Take 1 tablet (90 mg total) by mouth 2 (two) times daily. 09/07/21   Jonita Albee, PA-C  ? ? ?Family History ?Family History  ?Problem Relation Age of Onset  ? Tuberculosis Mother   ? Cancer Father   ?     colon  ? Stroke Father   ? Hypertension Sister   ? ? ?Social History ?Social History  ? ?Tobacco Use  ? Smoking status: Every Day  ?   Packs/day: 0.50  ?  Years: 35.00  ?  Pack years: 17.50  ?  Types: Cigarettes  ? Smokeless tobacco: Never  ?Substance Use Topics  ? Alcohol use: Yes  ?  Comment: occasionally  ? Drug use: No  ? ? ? ?Allergies   ?Darvocet [propoxyphene n-acetaminophen] and Sulfa antibiotics ? ? ?Review of Systems ?Review of Systems ?Per HPI ? ?Physical Exam ?Triage Vital Signs ?ED Triage Vitals  ?Enc Vitals Group  ?   BP 10/20/21 0940 (!) 159/91  ?   Pulse Rate 10/20/21 0940 100  ?   Resp 10/20/21 0940 18  ?   Temp 10/20/21 0940 97.9 ?F (36.6 ?C)  ?   Temp Source 10/20/21 0940 Oral  ?   SpO2 10/20/21 0940 97 %  ?   Weight 10/20/21 0939 205 lb 7.5 oz (93.2 kg)  ?   Height 10/20/21 0939 5\' 3"  (1.6 m)  ?   Head Circumference --   ?   Peak Flow --   ?   Pain Score 10/20/21 0939 0  ?   Pain Loc --   ?   Pain Edu? --   ?   Excl. in GC? --   ? ?No data found. ? ?Updated Vital Signs ?BP (!) 159/91 (BP Location: Left Arm)   Pulse 100   Temp 97.9 ?F (36.6 ?C) (Oral)   Resp 18   Ht 5\' 3"  (1.6 m)   Wt 205 lb 7.5 oz (93.2 kg)   SpO2 97%   BMI 36.40 kg/m?  ? ?Visual Acuity ?Right Eye Distance:   ?Left Eye Distance:   ?Bilateral Distance:   ? ?Right Eye Near:   ?Left Eye Near:    ?Bilateral Near:    ? ?Physical Exam ?Vitals and nursing note reviewed.  ?Constitutional:   ?   General: She is not in acute distress. ?   Appearance: Normal appearance. She is not toxic-appearing.  ?HENT:  ?   Mouth/Throat:  ?   Mouth: Mucous membranes are moist.  ?   Pharynx: Oropharynx is clear.  ?Pulmonary:  ?   Effort: Pulmonary effort is normal. No respiratory distress.  ?Skin: ?   General: Skin is warm and dry.  ?   Findings: Abscess present.  ?   Comments: Approximately 0.5 cm x 0.5 cm firm, slightly erythematous abscess to right cheek in area marked.  Slightly TTP.  No fluctuance, drainage.  ?Neurological:  ?   Mental Status: She is alert and oriented to person, place, and time.  ?   Motor: No weakness.  ?   Gait: Gait normal.  ?Psychiatric:     ?   Mood and  Affect: Mood normal.     ?   Behavior: Behavior normal.     ?   Thought Content: Thought content normal.     ?  Judgment: Judgment normal.  ? ? ? ?UC Treatments / Results  ?Labs ?(all labs ordered are listed, but only abnormal results are displayed) ?Labs Reviewed - No data to display ? ?EKG ? ? ?Radiology ?No results found. ? ?Procedures ?Procedures (including critical care time) ? ?Medications Ordered in UC ?Medications - No data to display ? ?Initial Impression / Assessment and Plan / UC Course  ?I have reviewed the triage vital signs and the nursing notes. ? ?Pertinent labs & imaging results that were available during my care of the patient were reviewed by me and considered in my medical decision making (see chart for details). ? ?  ?Treat facial abscess with doxycycline 100 mg twice daily for 7 days.  Encouraged use of warm compresses.  Follow up with Dermatologist if symptoms worsen or persist.  ?Final Clinical Impressions(s) / UC Diagnoses  ? ?Final diagnoses:  ?Facial abscess  ? ? ? ?Discharge Instructions   ? ?  ?- Please take the entire course of antibiotics. ?- You can continue to use warm compresses.  It is okay if the area starts to drain ?- Please make an appointment with your Dermatologist.  ?- If the area is not improving or if it continues to work despite the antibiotics, please follow up with primary care provider.  ? ? ? ? ?ED Prescriptions   ? ? Medication Sig Dispense Auth. Provider  ? doxycycline (VIBRAMYCIN) 100 MG capsule Take 1 capsule (100 mg total) by mouth 2 (two) times daily for 7 days. 14 capsule Valentino Nose, NP  ? ?  ? ?PDMP not reviewed this encounter. ?  ?Valentino Nose, NP ?10/20/21 1004 ? ?

## 2021-10-20 NOTE — Discharge Instructions (Addendum)
-   Please take the entire course of antibiotics. ?- You can continue to use warm compresses.  It is okay if the area starts to drain ?- Please make an appointment with your Dermatologist.  ?- If the area is not improving or if it continues to work despite the antibiotics, please follow up with primary care provider.  ?

## 2021-10-20 NOTE — ED Triage Notes (Signed)
Pt reports a facial cyst that appeared this morning. States the right side of her face is swollen.  ?

## 2021-10-21 ENCOUNTER — Telehealth (HOSPITAL_COMMUNITY): Payer: Self-pay | Admitting: Family Medicine

## 2021-10-21 MED ORDER — FLUCONAZOLE 150 MG PO TABS: 150.0000 mg | ORAL_TABLET | Freq: Once | ORAL | 0 refills | Status: AC

## 2021-10-21 NOTE — Telephone Encounter (Signed)
Pt had doxy sent in yesterday, and today calls requesting diflucan for potential yeast infection. Rx sent ?

## 2021-10-28 LAB — LIPID PANEL
Chol/HDL Ratio: 3.3 ratio (ref 0.0–4.4)
Cholesterol, Total: 128 mg/dL (ref 100–199)
HDL: 39 mg/dL — ABNORMAL LOW (ref >39)
LDL Chol Calc (NIH): 67 mg/dL (ref 0–99)
Triglycerides: 121 mg/dL (ref 0–149)
VLDL Cholesterol Cal: 22 mg/dL (ref 5–40)

## 2021-10-28 LAB — HEPATIC FUNCTION PANEL
ALT: 28 IU/L (ref 0–32)
AST: 30 IU/L (ref 0–40)
Albumin: 4.6 g/dL (ref 3.8–4.8)
Bilirubin Total: 0.5 mg/dL (ref 0.0–1.2)
Bilirubin, Direct: 0.18 mg/dL (ref 0.00–0.40)
Total Protein: 7.3 g/dL (ref 6.0–8.5)

## 2022-01-07 NOTE — Progress Notes (Signed)
Cardiology Office Note   Date:  01/11/2022   ID:  Miranda, Little 08-03-1956, MRN [id number]  PCP:  Pcp, No  Cardiologist:   Taksh Hjort Martinique, MD   Chief Complaint  Patient presents with   Follow-up    3-4 months.   Coronary Artery Disease      History of Present Illness: Miranda Little is a 66 y.o. female who presents for follow up HTN, hyperlipidemia, tobacco abuse, and coronary artery disease.   She presented with severe chest pain/pressure 09/04/2021.  This was associated with left arm numbness.  This episode woke her from her sleep.  She had never experienced anything like the episode before.  She reported that she had company visiting she did not want to go to the ED or frighten anyone.  She noted intermittent episodes of recurrent chest discomfort associated with shortness of breath over the next few days. She has a history of tobacco abuse.   She presented to the emergency department 09/06/2021.  In the emergency department she was noted to have blood pressure 173/96, elevated troponins 4068, EKG showed no ischemic changes.  Chest x-ray showed cardiomegaly and bilateral interstitial edema.  She was started on heparin and given aspirin in the emergency department.  She underwent cardiac catheterization 09/06/2021.  She was noted to have mid-distal circumflex lesion 99% stenosed with a 99% stenosed branch in the L PDA V.  She received PCI with DES x1 to her circumflex   She was placed on aspirin and Brilinta.  She was started on Lopressor 12.5 twice daily, and rosuvastatin 40 mg daily.  Her echocardiogram 09/07/2021 showed an LVEF of 55-60%, G1 DD, trivial mitral valve regurgitation and no other significant valvular abnormalities.  On follow up today she is feeling very well. No chest pain or dyspnea. Still trying to quit smoking using Wellbutrin and nicotine patches. Needs to increase walking. Tolerating medication well.     History reviewed. No pertinent past medical  history.  Past Surgical History:  Procedure Laterality Date   CARPAL TUNNEL RELEASE     CESAREAN SECTION     CHOLECYSTECTOMY N/A 03/18/2015   Procedure: LAPAROSCOPIC CHOLECYSTECTOMY WITH INTRAOPERATIVE CHOLANGIOGRAM;  Surgeon: Erroll Luna, MD;  Location: [facility];  Service: General;  Laterality: N/A;   CYST REMOVAL NECK     ERCP N/A 03/19/2015   Procedure: ENDOSCOPIC RETROGRADE CHOLANGIOPANCREATOGRAPHY (ERCP);  Surgeon: Clarene Essex, MD;  Location: [facility] ENDOS[facility];  Service: Endoscopy;  Laterality: N/A;   KNEE SURGERY Right    LEFT HEART CATH AND CORONARY ANGIOGRAPHY N/A 09/06/2021   Procedure: LEFT HEART CATH AND CORONARY ANGIOGRAPHY;  Surgeon: Leonie Man, MD;  Location: [facility] LAB;  Service: Cardiovascular;  Laterality: N/A;     Current Outpatient Medications  Medication Sig Dispense Refill   acetaminophen (TYLENOL) 325 MG tablet Take 650 mg by mouth every 6 (six) hours as needed for mild pain.     ALPRAZolam (XANAX) 0.25 MG tablet Take 0.125 mg by mouth every 8 (eight) hours as needed for sleep.     aspirin 81 MG EC tablet Take 1 tablet (81 mg total) by mouth daily. Swallow whole. 90 tablet 3   azelastine (ASTELIN) 0.1 % nasal spray Place 1 spray into both nostrils daily.     fexofenadine (ALLEGRA) 180 MG tablet Take 180 mg by mouth daily as needed for allergies or rhinitis.     fluticasone (FLONASE) 50 MCG/ACT nasal spray Place 1 spray into both nostrils every morning.  ibuprofen (ADVIL,MOTRIN) 200 MG tablet Take 200 mg by mouth every 6 (six) hours as needed for moderate pain.     metoprolol tartrate (LOPRESSOR) 25 MG tablet Take 1/2 tablet (12.5 mg total) by mouth 2 (two) times daily. 90 tablet 3   nitroGLYCERIN (NITROSTAT) 0.4 MG SL tablet Place 1 tablet (0.4 mg total) under the tongue every 5 (five) minutes x 3 doses as needed for chest pain. 25 tablet 4   rosuvastatin (CRESTOR) 40 MG tablet Take 1 tablet (40 mg total) by mouth daily. 90 tablet 3   ticagrelor (BRILINTA)  90 MG TABS tablet Take 1 tablet (90 mg total) by mouth 2 (two) times daily. 180 tablet 3   No current facility-administered medications for this visit.    Allergies:   Darvocet [propoxyphene n-acetaminophen] and Sulfa antibiotics    Social History:  The patient  reports that she has been smoking cigarettes. She has a 17.50 pack-year smoking history. She has never used smokeless tobacco. She reports current alcohol use. She reports that she does not use drugs.   Family History:  The patient's family history includes Cancer in her father; Hypertension in her sister; Stroke in her father; Tuberculosis in her mother.    ROS:  Please see the history of present illness.   Otherwise, review of systems are positive for .   All other systems are reviewed and negative.    PHYSICAL EXAM: VS:  BP (!) 144/76 (BP Location: Left Arm, Patient Position: Sitting, Cuff Size: Normal)   Pulse 82   Ht _0  (1.6 m)   Wt 202 lb (91.6 kg)   BMI 35.78 kg/m  , BMI Body mass index is 35.78 kg/m. GEN: Well nourished, well developed, in no acute distress HEENT: normal Neck: no JVD, carotid bruits, or masses Cardiac: RRR; no murmurs, rubs, or gallops,no edema  Respiratory:  clear to auscultation bilaterally, normal work of breathing GI: soft, nontender, nondistended, + BS MS: no deformity or atrophy Skin: warm and dry, no rash Neuro:  Strength and sensation are intact Psych: euthymic mood, full affect   EKG:  EKG is not ordered today. The ekg ordered today demonstrates N/A   Recent Labs: 09/06/2021: B Natriuretic Peptide 150.2; TSH 2.521 09/07/2021: BUN 13; Creatinine, Ser 0.88; Hemoglobin 14.2; Platelets 206; Potassium 3.9; Sodium 138 10/20/2021: ALT 28    Lipid Panel    Component Value Date/Time   CHOL 128 10/20/2021 0831   TRIG 121 10/20/2021 0831   HDL 39 (L) 10/20/2021 0831   CHOLHDL 3.3 10/20/2021 0831   CHOLHDL 5.4 09/06/2021 0510   VLDL 15 09/06/2021 0510   LDLCALC 67 10/20/2021 0831       Wt Readings from Last 3 Encounters:  01/11/22 202 lb (91.6 kg)  10/20/21 205 lb 7.5 oz (93.2 kg)  09/22/21 205 lb 6.4 oz (93.2 kg)      Other studies Reviewed: Additional studies/ records that were reviewed today include:   Echocardiogram 09/07/2021 IMPRESSIONS     1. Left ventricular ejection fraction, by estimation, is 55 to 60%. The  left ventricle has normal function. The left ventricle has no regional  wall motion abnormalities. Left ventricular diastolic parameters are  consistent with Grade I diastolic  dysfunction (impaired relaxation).   2. Right ventricular systolic function is normal. The right ventricular  size is normal. Tricuspid regurgitation signal is inadequate for assessing  PA pressure.   3. The mitral valve is normal in structure. Trivial mitral valve  regurgitation. No evidence of  mitral stenosis.   4. The aortic valve is tricuspid. Aortic valve regurgitation is trivial.  No aortic stenosis is present.   5. The inferior vena cava is normal in size with greater than 50%  respiratory variability, suggesting right atrial pressure of 3 mmHg.   Cardiac catheterization 09/06/2021   Mid Cx to Dist Cx lesion is 99% stenosed with 99% stenosed side branch in LPAV.   Balloon angioplasty was performed on the sidebranch, using a BALLN SAPPHIRE 2.0X12.   Post intervention, the side branch was reduced to 20% residual stenosis -> following stent placement and main branch..   A drug-eluting stent was successfully placed in the main branch across the LPA V sidebranch - using a SYNERGY XD 2.50X16 -> deployed/postdilated at high ATM with stent balloon to 2.8 mm.   Post intervention, there is a 0% residual stenosis.   LV end diastolic pressure is low.   SUMMARY Severe bifurcation disease involving Distal LCx-OM2 and AV groove LCx 99% stenosis ->  Successful bifurcation PCI with DES of mid to distal LCx-OM2 (Synergy DES 2.5 mm x 18 mm -postdilated 2.8 mm) and  PTCA of AV  groove LCx sidebranch (2.0 mm balloon) -> with only minimal 20% residual stenosis after jailing with stent. Otherwise normal coronary arteries with small Ramus Intermedius Initially borderline hypotension which resolved after 250 mL bolus.  LVEDP was only 6 mmHg.     RECOMMENDATIONS Patient has admission orders for overnight monitoring post PCI given NSTEMI. Continue aggressive risk factor modification based on GUIDELINE DIRECTED MEDICAL THERAPY => high-dose statin, beta-blocker, ACE/ARB 2D echo pending       Glenetta Hew, MD   Diagnostic Dominance: Right Intervention      ASSESSMENT AND PLAN:  1.  CAD with NSTEMI in January. S/p DES x1 to left circumflex  Continue aspirin, Brilinta, metoprolol, rosuvastatin Lifestyle modification. Increase physical activity as tolerated Will stop Brilinta at one year.    2. Hyperlipidemia-excellent response to Crestor. LDL down to 67. Continue dietary modification   3. Tobacco abuse-continues to work on cutting back on smoking.  Is now down to 1/2 pack per day.  Continue nicotine patch and Wellbutrin Smoking cessation recommended Smoking cessation information given   Current medicines are reviewed at length with the patient today.  The patient does not have concerns regarding medicines.  The following changes have been made:  no change  Labs/ tests ordered today include:  No orders of the defined types were placed in this encounter.        Disposition:   FU with me in 6 months  Signed, Shaheen Star Martinique, MD  01/11/2022 11:08 AM    [place] [address] [place], [place], [address] Phone [phone], Fax [phone]

## 2022-01-11 ENCOUNTER — Ambulatory Visit (INDEPENDENT_AMBULATORY_CARE_PROVIDER_SITE_OTHER): Payer: 59 | Admitting: Cardiology

## 2022-01-11 ENCOUNTER — Encounter: Payer: Self-pay | Admitting: Cardiology

## 2022-01-11 VITALS — BP 144/76 | HR 82 | Ht 63.0 in | Wt 202.0 lb

## 2022-01-11 DIAGNOSIS — E782 Mixed hyperlipidemia: Secondary | ICD-10-CM

## 2022-01-11 DIAGNOSIS — I25118 Atherosclerotic heart disease of native coronary artery with other forms of angina pectoris: Secondary | ICD-10-CM | POA: Diagnosis not present

## 2022-01-11 DIAGNOSIS — Z720 Tobacco use: Secondary | ICD-10-CM | POA: Diagnosis not present

## 2022-01-11 NOTE — Patient Instructions (Signed)

## 2022-03-07 ENCOUNTER — Other Ambulatory Visit (HOSPITAL_BASED_OUTPATIENT_CLINIC_OR_DEPARTMENT_OTHER): Payer: Self-pay | Admitting: Family Medicine

## 2022-03-07 DIAGNOSIS — R0602 Shortness of breath: Secondary | ICD-10-CM

## 2022-04-20 ENCOUNTER — Other Ambulatory Visit (HOSPITAL_COMMUNITY): Payer: Self-pay

## 2022-04-26 ENCOUNTER — Other Ambulatory Visit (HOSPITAL_COMMUNITY): Payer: Self-pay

## 2022-05-24 ENCOUNTER — Other Ambulatory Visit: Payer: Self-pay | Admitting: Cardiology

## 2022-06-05 ENCOUNTER — Other Ambulatory Visit: Payer: Self-pay | Admitting: Family Medicine

## 2022-06-05 DIAGNOSIS — R0602 Shortness of breath: Secondary | ICD-10-CM

## 2022-07-10 ENCOUNTER — Other Ambulatory Visit: Payer: Self-pay | Admitting: Cardiology

## 2022-07-11 NOTE — Progress Notes (Unsigned)
Cardiology Office Note   Date:  07/13/2022   ID:  Terecia, Plaut [birth year], MRN [id number]  PCP:  Pcp, No  Cardiologist:   Hser Belanger [place], MD   Chief Complaint  Patient presents with   Coronary Artery Disease      History of Present Illness: Miranda Little is a 66 y.o. female who presents for follow up HTN, hyperlipidemia, tobacco abuse, and coronary artery disease.   She presented with severe chest pain/pressure 09/04/2021.  This was associated with left arm numbness.  This episode woke her from her sleep.  She had never experienced anything like the episode before.  She reported that she had company visiting she did not want to go to the ED or frighten anyone.  She noted intermittent episodes of recurrent chest discomfort associated with shortness of breath over the next few days. She has a history of tobacco abuse.   She presented to the emergency department 09/06/2021.  In the emergency department she was noted to have blood pressure 173/96, elevated troponins 4068, EKG showed no ischemic changes.  Chest x-ray showed cardiomegaly and bilateral interstitial edema.  She was started on heparin and given aspirin in the emergency department.  She underwent cardiac catheterization 09/06/2021.  She was noted to have mid-distal circumflex lesion 99% stenosed with a 99% stenosed branch in the L PDA V.  She received PCI with DES x1 to her circumflex   She was placed on aspirin and Brilinta.  She was started on Lopressor 12.5 twice daily, and rosuvastatin 40 mg daily.  Her echocardiogram 09/07/2021 showed an LVEF of 55-60%, G1 DD, trivial mitral valve regurgitation and no other significant valvular abnormalities.  On follow up today she is doing OK.  No chest pain or dyspnea. She was unable to quit smoking using Wellbutrin and nicotine patches. Smoking 10 cigs/day now. She is walking. Tolerating medication well. Does note fear since her heart attack of this happening again.     History  reviewed. No pertinent past medical history.  Past Surgical History:  Procedure Laterality Date   CARPAL TUNNEL RELEASE     CESAREAN SECTION     CHOLECYSTECTOMY N/A 03/18/2015   Procedure: LAPAROSCOPIC CHOLECYSTECTOMY WITH INTRAOPERATIVE CHOLANGIOGRAM;  Surgeon: Erroll Luna, MD;  Location: [place];  Service: General;  Laterality: N/A;   CYST REMOVAL NECK     ERCP N/A 03/19/2015   Procedure: ENDOSCOPIC RETROGRADE CHOLANGIOPANCREATOGRAPHY (ERCP);  Surgeon: Clarene Essex, MD;  Location: [facility] ENDOSCOPY;  Service: Endoscopy;  Laterality: N/A;   KNEE SURGERY Right    LEFT HEART CATH AND CORONARY ANGIOGRAPHY N/A 09/06/2021   Procedure: LEFT HEART CATH AND CORONARY ANGIOGRAPHY;  Surgeon: Leonie Man, MD;  Location: [place] [facility];  Service: Cardiovascular;  Laterality: N/A;     Current Outpatient Medications  Medication Sig Dispense Refill   acetaminophen (TYLENOL) 325 MG tablet Take 650 mg by mouth every 6 (six) hours as needed for mild pain.     ALPRAZolam (XANAX) 0.25 MG tablet Take 0.125 mg by mouth every 8 (eight) hours as needed for sleep.     aspirin 81 MG EC tablet Take 1 tablet (81 mg total) by mouth daily. Swallow whole. 90 tablet 3   azelastine (ASTELIN) 0.1 % nasal spray Place 1 spray into both nostrils daily.     fexofenadine (ALLEGRA) 180 MG tablet Take 180 mg by mouth daily as needed for allergies or rhinitis.     fluticasone (FLONASE) 50 MCG/ACT nasal spray Place  1 spray into both nostrils every morning.     metoprolol succinate (TOPROL XL) 25 MG 24 hr tablet Take 1 tablet (25 mg total) by mouth daily. 90 tablet 3   nitroGLYCERIN (NITROSTAT) 0.4 MG SL tablet Place 1 tablet (0.4 mg total) under the tongue every 5 (five) minutes x 3 doses as needed for chest pain. 25 tablet 4   rosuvastatin (CRESTOR) 40 MG tablet Take 1 tablet (40 mg total) by mouth daily. 90 tablet 3   ticagrelor (BRILINTA) 90 MG TABS tablet Take 1 tablet (90 mg total) by mouth 2 (two) times daily. 180 tablet 3    varenicline (CHANTIX CONTINUING MONTH PAK) 1 MG tablet Take 1 tablet (1 mg total) by mouth 2 (two) times daily. 60 tablet 2   varenicline (CHANTIX) 0.5 MG tablet Take 1 tablet (0.5 mg total) by mouth 2 (two) times daily. 60 tablet 0   No current facility-administered medications for this visit.    Allergies:   Darvocet [propoxyphene n-acetaminophen] and Sulfa antibiotics    Social History:  The patient  reports that she has been smoking cigarettes. She has a 17.50 pack-year smoking history. She has never used smokeless tobacco. She reports current alcohol use. She reports that she does not use drugs.   Family History:  The patient's family history includes Cancer in her father; Hypertension in her sister; Stroke in her father; Tuberculosis in her mother.    ROS:  Please see the history of present illness.   Otherwise, review of systems are positive for .   All other systems are reviewed and negative.    PHYSICAL EXAM: VS:  BP (!) 140/78   Pulse 96   Ht _0  (1.6 m)   Wt 212 lb 6.4 oz (96.3 kg)   SpO2 94%   BMI 37.62 kg/m  , BMI Body mass index is 37.62 kg/m. GEN: Well nourished, well developed, in no acute distress HEENT: normal Neck: no JVD, carotid bruits, or masses Cardiac: RRR; no murmurs, rubs, or gallops,no edema  Respiratory:  clear to auscultation bilaterally, normal work of breathing GI: soft, nontender, nondistended, + BS MS: no deformity or atrophy Skin: warm and dry, no rash Neuro:  Strength and sensation are intact Psych: euthymic mood, full affect   EKG:  EKG is not ordered today. The ekg ordered today demonstrates N/A   Recent Labs: 09/06/2021: B Natriuretic Peptide 150.2; TSH 2.521 09/07/2021: BUN 13; Creatinine, Ser 0.88; Hemoglobin 14.2; Platelets 206; Potassium 3.9; Sodium 138 10/20/2021: ALT 28    Lipid Panel    Component Value Date/Time   CHOL 128 10/20/2021 0831   TRIG 121 10/20/2021 0831   HDL 39 (L) 10/20/2021 0831   CHOLHDL 3.3 10/20/2021  0831   CHOLHDL 5.4 09/06/2021 0510   VLDL 15 09/06/2021 0510   LDLCALC 67 10/20/2021 0831      Wt Readings from Last 3 Encounters:  07/13/22 212 lb 6.4 oz (96.3 kg)  01/11/22 202 lb (91.6 kg)  10/20/21 205 lb 7.5 oz (93.2 kg)      Other studies Reviewed: Additional studies/ records that were reviewed today include:   Echocardiogram 09/07/2021 IMPRESSIONS     1. Left ventricular ejection fraction, by estimation, is 55 to 60%. The  left ventricle has normal function. The left ventricle has no regional  wall motion abnormalities. Left ventricular diastolic parameters are  consistent with Grade I diastolic  dysfunction (impaired relaxation).   2. Right ventricular systolic function is normal. The right ventricular  size is normal. Tricuspid regurgitation signal is inadequate for assessing  PA pressure.   3. The mitral valve is normal in structure. Trivial mitral valve  regurgitation. No evidence of mitral stenosis.   4. The aortic valve is tricuspid. Aortic valve regurgitation is trivial.  No aortic stenosis is present.   5. The inferior vena cava is normal in size with greater than 50%  respiratory variability, suggesting right atrial pressure of 3 mmHg.   Cardiac catheterization 09/06/2021   Mid Cx to Dist Cx lesion is 99% stenosed with 99% stenosed side branch in LPAV.   Balloon angioplasty was performed on the sidebranch, using a BALLN SAPPHIRE 2.0X12.   Post intervention, the side branch was reduced to 20% residual stenosis -> following stent placement and main branch..   A drug-eluting stent was successfully placed in the main branch across the LPA V sidebranch - using a SYNERGY XD 2.50X16 -> deployed/postdilated at high ATM with stent balloon to 2.8 mm.   Post intervention, there is a 0% residual stenosis.   LV end diastolic pressure is low.   SUMMARY Severe bifurcation disease involving Distal LCx-OM2 and AV groove LCx 99% stenosis ->  Successful bifurcation PCI with  DES of mid to distal LCx-OM2 (Synergy DES 2.5 mm x 18 mm -postdilated 2.8 mm) and  PTCA of AV groove LCx sidebranch (2.0 mm balloon) -> with only minimal 20% residual stenosis after jailing with stent. Otherwise normal coronary arteries with small Ramus Intermedius Initially borderline hypotension which resolved after 250 mL bolus.  LVEDP was only 6 mmHg.     RECOMMENDATIONS Patient has admission orders for overnight monitoring post PCI given NSTEMI. Continue aggressive risk factor modification based on GUIDELINE DIRECTED MEDICAL THERAPY => high-dose statin, beta-blocker, ACE/ARB 2D echo pending       Glenetta Hew, MD   Diagnostic Dominance: Right Intervention      ASSESSMENT AND PLAN:  1.  CAD with NSTEMI in January. S/p DES x1 to left circumflex  Continue aspirin, Brilinta, metoprolol, rosuvastatin. May come off Brilinta on Jan 31.  Lifestyle modification. Increase physical activity as tolerated   2. Hyperlipidemia-on high dose Crestor. Will follow up fasting labs.   3. Tobacco abuse-failed cessation on Wellbutrin and patches.  Discussed Chantix - start at 0.5 mg bid and then 1 mg bid after first month. She is to pick a stop date. Counseled her to stop  medication if she experiences any mood swings, paranoia or weird dreams. Only use for 3 months  Smoking cessation recommended Smoking cessation information given   Current medicines are reviewed at length with the patient today.  The patient does not have concerns regarding medicines.  The following changes have been made:  no change  Labs/ tests ordered today include:   Orders Placed This Encounter  Procedures   Basic metabolic panel   Lipid panel   Hepatic function panel         Disposition:   FU with me in 6 months  Signed, Miranda Watson [place], MD  07/13/2022 4:14 PM    [facility] [address] [place], [place], [address] Phone [phone], Fax [phone]

## 2022-07-13 ENCOUNTER — Encounter: Payer: Self-pay | Admitting: Cardiology

## 2022-07-13 ENCOUNTER — Ambulatory Visit: Payer: 59 | Attending: Cardiology | Admitting: Cardiology

## 2022-07-13 VITALS — BP 140/78 | HR 96 | Ht 63.0 in | Wt 212.4 lb

## 2022-07-13 DIAGNOSIS — I25110 Atherosclerotic heart disease of native coronary artery with unstable angina pectoris: Secondary | ICD-10-CM

## 2022-07-13 DIAGNOSIS — Z720 Tobacco use: Secondary | ICD-10-CM

## 2022-07-13 DIAGNOSIS — E782 Mixed hyperlipidemia: Secondary | ICD-10-CM | POA: Diagnosis not present

## 2022-07-13 MED ORDER — VARENICLINE TARTRATE 1 MG PO TABS: 1.0000 mg | ORAL_TABLET | Freq: Two times a day (BID) | ORAL | 2 refills | Status: DC

## 2022-07-13 MED ORDER — METOPROLOL SUCCINATE ER 25 MG PO TB24: 25.0000 mg | ORAL_TABLET | Freq: Every day | ORAL | 3 refills | Status: DC

## 2022-07-13 MED ORDER — VARENICLINE TARTRATE 0.5 MG PO TABS: 0.5000 mg | ORAL_TABLET | Freq: Two times a day (BID) | ORAL | 0 refills | Status: DC

## 2022-07-13 NOTE — Patient Instructions (Signed)
We will switch metoprolol tartrate to Succinate (Toprol XL) 25 mg once a day  On Jan 31 you may stop taking Brilinta  We will try Chantix for smoking cessation. Start 0.5 mg twice a day for the first month and if tolerated increase to 1 mg twice a day  We will check fasting lab work.

## 2022-07-16 ENCOUNTER — Encounter: Payer: Self-pay | Admitting: Cardiology

## 2022-08-08 ENCOUNTER — Other Ambulatory Visit: Payer: Self-pay | Admitting: Cardiology

## 2022-09-05 LAB — HEPATIC FUNCTION PANEL
ALT: 31 IU/L (ref 0–32)
AST: 33 IU/L (ref 0–40)
Albumin: 4.3 g/dL (ref 3.9–4.9)
Alkaline Phosphatase: 92 IU/L (ref 44–121)
Bilirubin Total: 0.3 mg/dL (ref 0.0–1.2)
Bilirubin, Direct: 0.11 mg/dL (ref 0.00–0.40)
Total Protein: 7.1 g/dL (ref 6.0–8.5)

## 2022-09-05 LAB — LIPID PANEL
Chol/HDL Ratio: 2.8 ratio (ref 0.0–4.4)
Cholesterol, Total: 135 mg/dL (ref 100–199)
HDL: 49 mg/dL (ref >39)
LDL Chol Calc (NIH): 69 mg/dL (ref 0–99)
Triglycerides: 89 mg/dL (ref 0–149)
VLDL Cholesterol Cal: 17 mg/dL (ref 5–40)

## 2022-09-05 LAB — BASIC METABOLIC PANEL
BUN/Creatinine Ratio: 15 (ref 12–28)
BUN: 13 mg/dL (ref 8–27)
CO2: 22 mmol/L (ref 20–29)
Calcium: 9.4 mg/dL (ref 8.7–10.3)
Chloride: 103 mmol/L (ref 96–106)
Creatinine, Ser: 0.89 mg/dL (ref 0.57–1.00)
Glucose: 97 mg/dL (ref 70–99)
Potassium: 4.1 mmol/L (ref 3.5–5.2)
Sodium: 142 mmol/L (ref 134–144)
eGFR: 71 mL/min/{1.73_m2} (ref >59)

## 2023-04-03 ENCOUNTER — Other Ambulatory Visit: Payer: Self-pay | Admitting: Cardiology

## 2023-05-04 ENCOUNTER — Encounter: Payer: Self-pay | Admitting: Cardiology

## 2023-05-04 ENCOUNTER — Ambulatory Visit: Payer: 59 | Attending: Cardiology | Admitting: Cardiology

## 2023-05-04 VITALS — BP 138/80 | HR 90 | Ht 63.0 in | Wt 213.0 lb

## 2023-05-04 DIAGNOSIS — E782 Mixed hyperlipidemia: Secondary | ICD-10-CM

## 2023-05-04 DIAGNOSIS — M791 Myalgia, unspecified site: Secondary | ICD-10-CM

## 2023-05-04 DIAGNOSIS — I25118 Atherosclerotic heart disease of native coronary artery with other forms of angina pectoris: Secondary | ICD-10-CM

## 2023-05-04 DIAGNOSIS — T466X5A Adverse effect of antihyperlipidemic and antiarteriosclerotic drugs, initial encounter: Secondary | ICD-10-CM

## 2023-05-04 DIAGNOSIS — I10 Essential (primary) hypertension: Secondary | ICD-10-CM

## 2023-05-04 DIAGNOSIS — Z720 Tobacco use: Secondary | ICD-10-CM

## 2023-05-04 MED ORDER — VARENICLINE TARTRATE 0.5 MG PO TABS: 0.5000 mg | ORAL_TABLET | Freq: Two times a day (BID) | ORAL | 0 refills | Status: DC

## 2023-05-04 MED ORDER — VARENICLINE TARTRATE 1 MG PO TABS: 1.0000 mg | ORAL_TABLET | Freq: Two times a day (BID) | ORAL | 2 refills | Status: DC

## 2023-05-04 NOTE — Patient Instructions (Signed)
Medication Instructions:  Restart Chantix take as directed Continue all other medications *If you need a refill on your cardiac medications before your next appointment, please call your pharmacy*   Lab Work: Bmet,lipid and heptic panels,cpk to be done fasting   Testing/Procedures: None ordered   Follow-Up: At [facility], you and your health needs are our priority.  As part of our continuing mission to provide you with exceptional heart care, we have created designated Provider Care Teams.  These Care Teams include your primary Cardiologist (physician) and Advanced Practice Providers (APPs -  Physician Assistants and Nurse Practitioners) who all work together to provide you with the care you need, when you need it.  We recommend signing up for the patient portal called "MyChart".  Sign up information is provided on this After Visit Summary.  MyChart is used to connect with patients for Virtual Visits (Telemedicine).  Patients are able to view lab/test results, encounter notes, upcoming appointments, etc.  Non-urgent messages can be sent to your provider as well.   To learn more about what you can do with MyChart, go to [email or web address].    Your next appointment:  6 months    Provider:  Dr.Jordan      Monitor blood pressure at home if 130/80 let Dr.Jordan know

## 2023-05-24 ENCOUNTER — Telehealth: Payer: Self-pay | Admitting: Cardiology

## 2023-05-24 DIAGNOSIS — I25110 Atherosclerotic heart disease of native coronary artery with unstable angina pectoris: Secondary | ICD-10-CM

## 2023-05-24 DIAGNOSIS — I10 Essential (primary) hypertension: Secondary | ICD-10-CM

## 2023-05-24 DIAGNOSIS — E7800 Pure hypercholesterolemia, unspecified: Secondary | ICD-10-CM

## 2023-05-24 NOTE — Telephone Encounter (Signed)
Patient stopped by desk saying she was told to check blood pressure every week and it hasn't been coming down. Wants to know what to do.

## 2023-05-25 LAB — HEPATIC FUNCTION PANEL
ALT: 27 [IU]/L (ref 0–32)
AST: 35 [IU]/L (ref 0–40)
Albumin: 4.2 g/dL (ref 3.9–4.9)
Alkaline Phosphatase: 93 [IU]/L (ref 44–121)
Bilirubin Total: 0.4 mg/dL (ref 0.0–1.2)
Bilirubin, Direct: 0.12 mg/dL (ref 0.00–0.40)
Total Protein: 7.5 g/dL (ref 6.0–8.5)

## 2023-05-25 LAB — BASIC METABOLIC PANEL
BUN/Creatinine Ratio: 14 (ref 12–28)
BUN: 11 mg/dL (ref 8–27)
CO2: 24 mmol/L (ref 20–29)
Calcium: 9.6 mg/dL (ref 8.7–10.3)
Chloride: 102 mmol/L (ref 96–106)
Creatinine, Ser: 0.78 mg/dL (ref 0.57–1.00)
Glucose: 108 mg/dL — ABNORMAL HIGH (ref 70–99)
Potassium: 4.7 mmol/L (ref 3.5–5.2)
Sodium: 139 mmol/L (ref 134–144)
eGFR: 83 mL/min/{1.73_m2} (ref >59)

## 2023-05-25 LAB — CK: Total CK: 85 U/L (ref 32–182)

## 2023-05-25 LAB — TSH: TSH: 3.17 u[IU]/mL (ref 0.450–4.500)

## 2023-05-25 LAB — LIPID PANEL
Chol/HDL Ratio: 3.2 {ratio} (ref 0.0–4.4)
Cholesterol, Total: 138 mg/dL (ref 100–199)
HDL: 43 mg/dL (ref >39)
LDL Chol Calc (NIH): 73 mg/dL (ref 0–99)
Triglycerides: 120 mg/dL (ref 0–149)
VLDL Cholesterol Cal: 22 mg/dL (ref 5–40)

## 2023-05-25 NOTE — Telephone Encounter (Signed)
Spoke to patient she wanted Dr.Jordan to know her B/P is still elevated.B/P ranging 151/70 to 138/80.She did not check pulse.She takes Metoprolol Succ 25 mg daily.Advised Dr.Jordan is out of office today.I will send message to him for advice.

## 2023-05-28 ENCOUNTER — Other Ambulatory Visit: Payer: Self-pay

## 2023-05-28 MED ORDER — LOSARTAN POTASSIUM 50 MG PO TABS: 50.0000 mg | ORAL_TABLET | Freq: Every day | ORAL | 3 refills | Status: DC

## 2023-05-28 MED ORDER — EZETIMIBE 10 MG PO TABS: 10.0000 mg | ORAL_TABLET | Freq: Every day | ORAL | 3 refills | Status: DC

## 2023-05-28 NOTE — Telephone Encounter (Signed)
Spoke to patient Dr.Jordan advised to start Losartan 50 mg daily.Bmet in 2 weeks.Lab order mailed.She will continue to monitor B/P and call back if elevated.

## 2023-06-12 LAB — BASIC METABOLIC PANEL
BUN/Creatinine Ratio: 15 (ref 12–28)
BUN: 12 mg/dL (ref 8–27)
CO2: 23 mmol/L (ref 20–29)
Calcium: 9.7 mg/dL (ref 8.7–10.3)
Chloride: 102 mmol/L (ref 96–106)
Creatinine, Ser: 0.81 mg/dL (ref 0.57–1.00)
Glucose: 112 mg/dL — ABNORMAL HIGH (ref 70–99)
Potassium: 4.6 mmol/L (ref 3.5–5.2)
Sodium: 141 mmol/L (ref 134–144)
eGFR: 79 mL/min/{1.73_m2} (ref >59)

## 2023-08-25 IMAGING — CR DG CHEST 2V
2 series · 2 of 2 positions shown · non-contrast
Comparison: 03/18/2015

CLINICAL DATA: Chest pain for 2 days, initial encounter

EXAM:
CHEST - 2 VIEW

[chest pa]
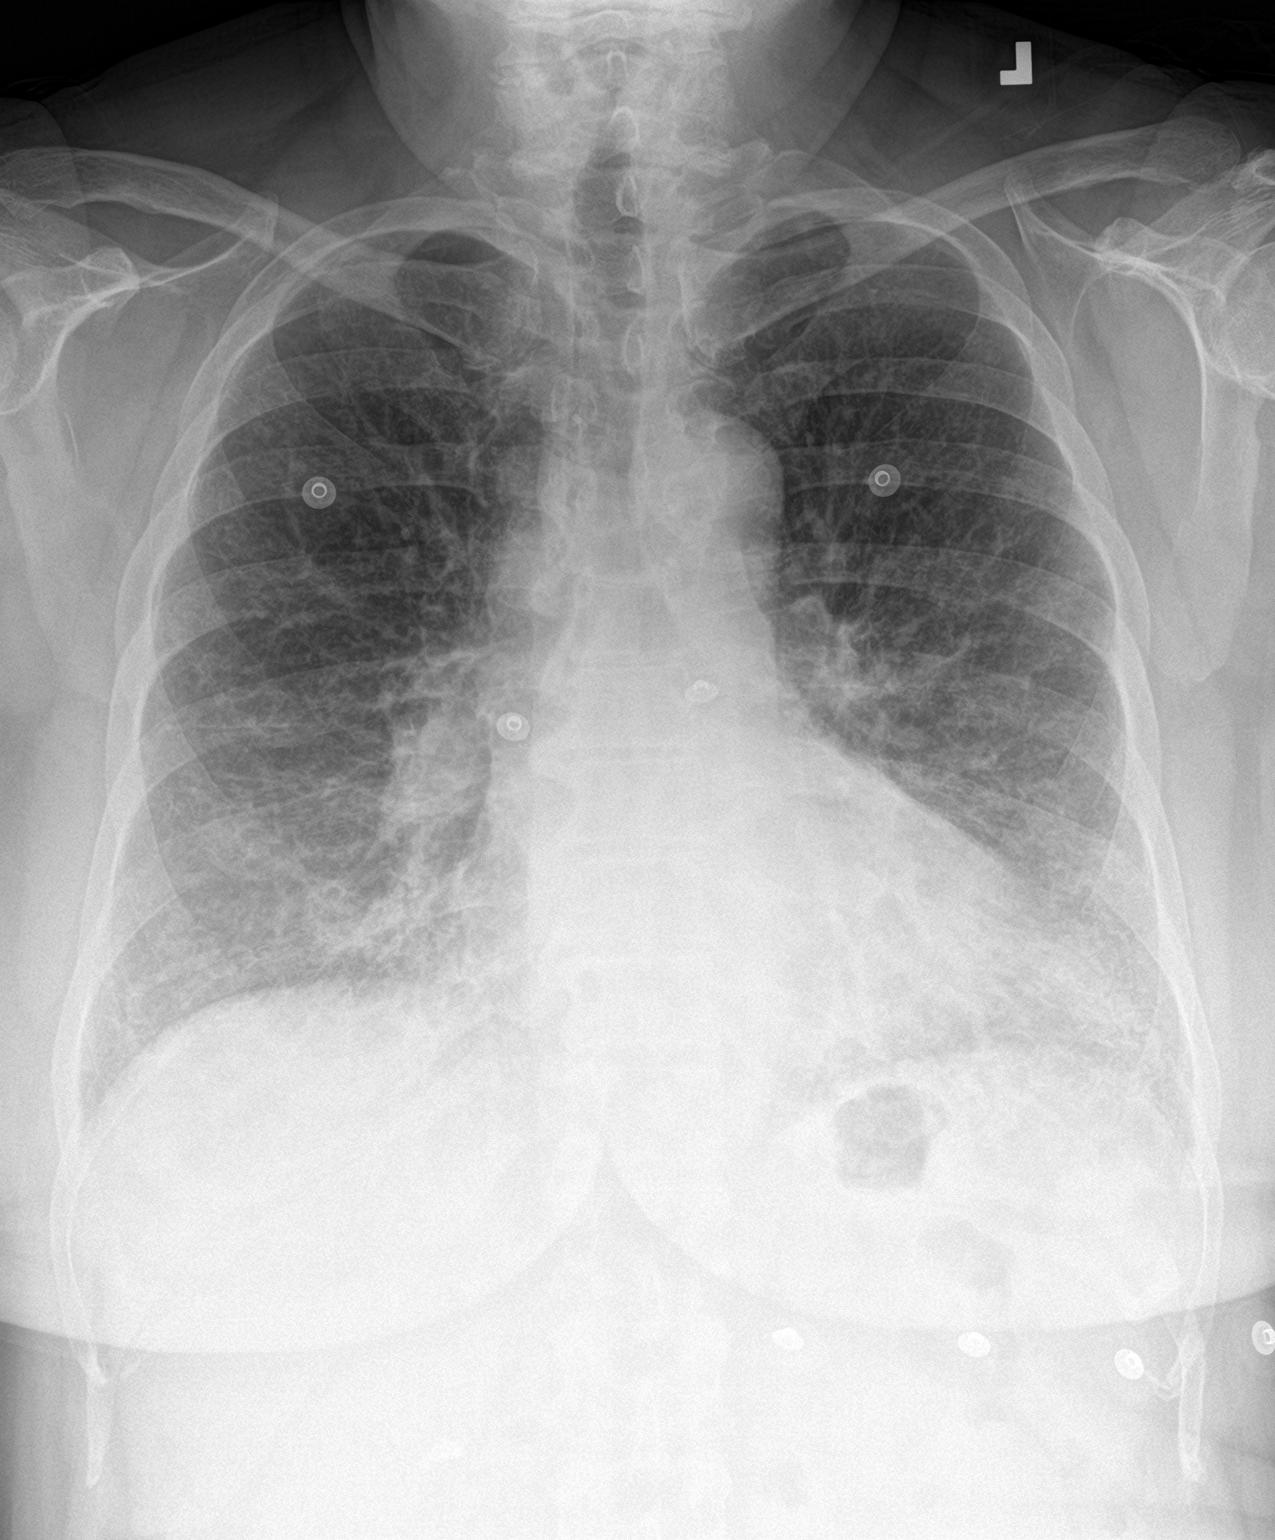

[chest lat]
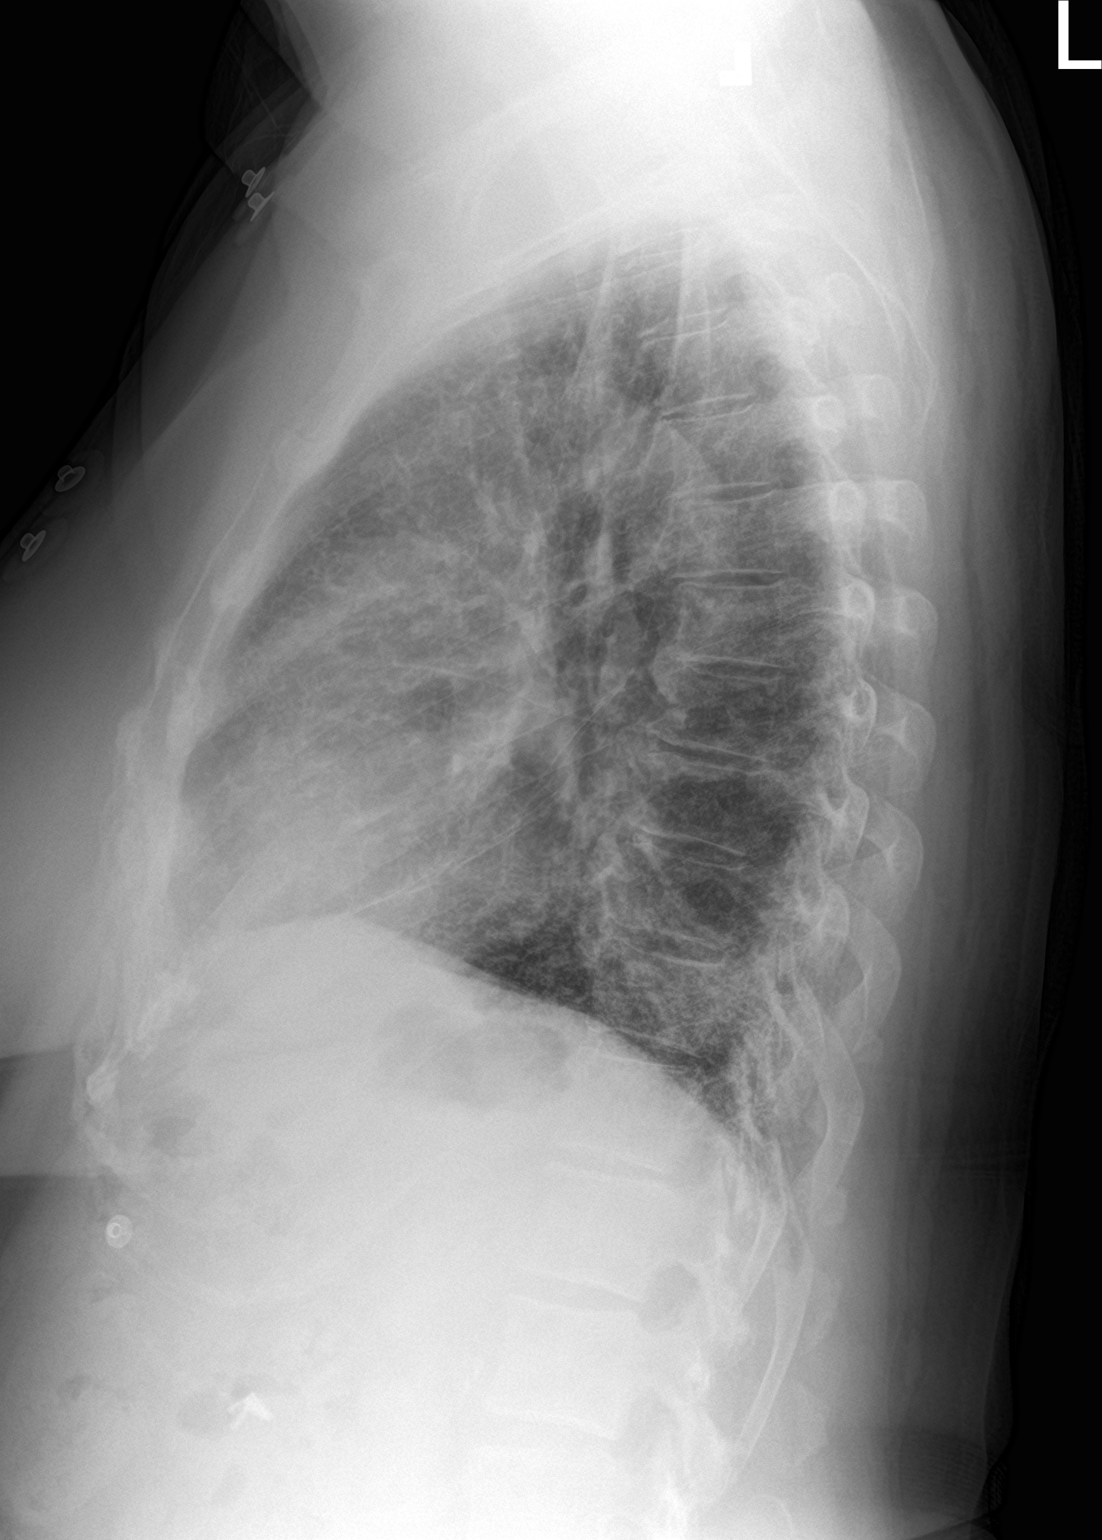

[2 of 2 positions shown; findings below may reference images not displayed]

FINDINGS: Check shadow is stable. Mild central vascular congestion is noted
with interstitial edema. No sizable effusion is noted. No bony
abnormality is seen.
IMPRESSION: Mild changes of CHF.

## 2023-08-28 LAB — HEPATIC FUNCTION PANEL
ALT: 28 [IU]/L (ref 0–32)
AST: 33 [IU]/L (ref 0–40)
Albumin: 4.5 g/dL (ref 3.9–4.9)
Alkaline Phosphatase: 104 [IU]/L (ref 44–121)
Bilirubin Total: 0.5 mg/dL (ref 0.0–1.2)
Bilirubin, Direct: 0.18 mg/dL (ref 0.00–0.40)
Total Protein: 7.3 g/dL (ref 6.0–8.5)

## 2023-08-28 LAB — LIPID PANEL
Chol/HDL Ratio: 3.3 {ratio} (ref 0.0–4.4)
Cholesterol, Total: 143 mg/dL (ref 100–199)
HDL: 44 mg/dL (ref >39)
LDL Chol Calc (NIH): 81 mg/dL (ref 0–99)
Triglycerides: 94 mg/dL (ref 0–149)
VLDL Cholesterol Cal: 18 mg/dL (ref 5–40)

## 2023-10-28 NOTE — Progress Notes (Unsigned)
 Cardiology Office Note   Date:  11/01/2023   ID:  Ashiya, Kinkead 06/05/1956, MRN [id number]  PCP:  Pcp, No  Cardiologist:   Chelly Dombeck [place], MD   Chief Complaint  Patient presents with   Coronary Artery Disease      History of Present Illness: Miranda Little is a 68 y.o. female who presents for follow up HTN, hyperlipidemia, tobacco abuse, and coronary artery disease.   She presented in Jan 2023 with NSTEMI.   She underwent cardiac catheterization 09/06/2021.  She was noted to have mid-distal circumflex lesion 99% stenosed with a 99% stenosed branch in the L PDA V.  She received PCI with DES x1 to her circumflex. Her echocardiogram 09/07/2021 showed an LVEF of 55-60%, G1 DD, trivial mitral valve regurgitation and no other significant valvular abnormalities.  On follow up today she denies any chest pain. Her house is 3 levels and she does get SOB at the top of the stairs. She is still smoking. Could not tolerate Chantix due to panic attacks and nightmares. BP has been good. Notes arthralgias even with lower Crestor dose.     Past Medical History:  Diagnosis Date   CAD (coronary artery disease)    Chest pain    Chest pressure    Elevated troponin    Hyperlipidemia    Interstitial edema    Left arm numbness    Tobacco abuse     Past Surgical History:  Procedure Laterality Date   CARPAL TUNNEL RELEASE     CESAREAN SECTION     CHOLECYSTECTOMY N/A 03/18/2015   Procedure: LAPAROSCOPIC CHOLECYSTECTOMY WITH INTRAOPERATIVE CHOLANGIOGRAM;  Surgeon: Harriette Bouillon, MD;  Location: [facility] OR;  Service: General;  Laterality: N/A;   CYST REMOVAL NECK     ERCP N/A 03/19/2015   Procedure: ENDOSCOPIC RETROGRADE CHOLANGIOPANCREATOGRAPHY (ERCP);  Surgeon: Vida Rigger, MD;  Location: [facility] ENDOSCOPY;  Service: Endoscopy;  Laterality: N/A;   KNEE SURGERY Right    LEFT HEART CATH AND CORONARY ANGIOGRAPHY N/A 09/06/2021   Procedure: LEFT HEART CATH AND CORONARY ANGIOGRAPHY;  Surgeon: Marykay Lex, MD;  Location: [facility] INVASIVE CV LAB;  Service: Cardiovascular;  Laterality: N/A;     Current Outpatient Medications  Medication Sig Dispense Refill   acetaminophen (TYLENOL) 325 MG tablet Take 650 mg by mouth every 6 (six) hours as needed for mild pain.     ASPIRIN LOW DOSE 81 MG tablet TAKE ONE TABLET BY MOUTH DAILY. swallow whole. 90 tablet 3   metoprolol succinate (TOPROL-XL) 25 MG 24 hr tablet TAKE ONE TABLET BY MOUTH EVERY DAY 90 tablet 2   nitroGLYCERIN (NITROSTAT) 0.4 MG SL tablet Place 1 tablet (0.4 mg total) under the tongue every 5 (five) minutes x 3 doses as needed for chest pain. 25 tablet 4   rosuvastatin (CRESTOR) 40 MG tablet Take 1/2 tablet ( 20 mg ) daily     ezetimibe (ZETIA) 10 MG tablet Take 1 tablet (10 mg total) by mouth daily. 90 tablet 3   losartan (COZAAR) 50 MG tablet Take 1 tablet (50 mg total) by mouth daily. 90 tablet 3   No current facility-administered medications for this visit.    Allergies:   Darvocet [propoxyphene n-acetaminophen] and Sulfa antibiotics    Social History:  The patient  reports that she has been smoking cigarettes. She has a 17.5 pack-year smoking history. She has never used smokeless tobacco. She reports current alcohol use. She reports that she does not use drugs.  Family History:  The patient's family history includes Cancer in her father; Hypertension in her sister; Stroke in her father; Tuberculosis in her mother.    ROS:  Please see the history of present illness.   Otherwise, review of systems are positive for .   All other systems are reviewed and negative.    PHYSICAL EXAM: VS:  BP (!) 140/80 (BP Location: Left Arm, Cuff Size: Large)   Pulse 88   Ht 5\' 3"  (1.6 m)   Wt 212 lb (96.2 kg)   BMI 37.55 kg/m  , BMI Body mass index is 37.55 kg/m. GEN: Well nourished, well developed, in no acute distress HEENT: normal Neck: no JVD, carotid bruits, or masses Cardiac: RRR; no murmurs, rubs, or gallops,no edema  Respiratory:   clear to auscultation bilaterally, normal work of breathing GI: soft, nontender, nondistended, + BS MS: no deformity or atrophy Skin: warm and dry, no rash Neuro:  Strength and sensation are intact Psych: euthymic mood, full affect   EKG Interpretation Date/Time:  Thursday November 01 2023 11:12:50 EDT Ventricular Rate:  88 PR Interval:  176 QRS Duration:  104 QT Interval:  378 QTC Calculation: 457 R Axis:   262  Text Interpretation: Normal sinus rhythm Right superior axis deviation Incomplete right bundle branch block When compared with ECG of 04-May-2023 08:18, No significant change was found Confirmed by [place], Ciena Sampley [id number]) on 11/01/2023 11:15:23 AM    Recent Labs: 05/24/2023: TSH 3.170 06/11/2023: BUN 12; Creatinine, Ser 0.81; Potassium 4.6; Sodium 141 08/28/2023: ALT 28    Lipid Panel    Component Value Date/Time   CHOL 143 08/28/2023 0952   TRIG 94 08/28/2023 0952   HDL 44 08/28/2023 0952   CHOLHDL 3.3 08/28/2023 0952   CHOLHDL 5.4 09/06/2021 0510   VLDL 15 09/06/2021 0510   LDLCALC 81 08/28/2023 0952   EKG Interpretation Date/Time:  Thursday November 01 2023 11:12:50 EDT Ventricular Rate:  88 PR Interval:  176 QRS Duration:  104 QT Interval:  378 QTC Calculation: 457 R Axis:   262  Text Interpretation: Normal sinus rhythm Right superior axis deviation Incomplete right bundle branch block When compared with ECG of 04-May-2023 08:18, No significant change was found Confirmed by [place], Shanika Levings [phone]) on 11/01/2023 11:15:23 AM    Wt Readings from Last 3 Encounters:  11/01/23 212 lb (96.2 kg)  05/04/23 213 lb (96.6 kg)  07/13/22 212 lb 6.4 oz (96.3 kg)      Other studies Reviewed: Additional studies/ records that were reviewed today include:   Echocardiogram 09/07/2021 IMPRESSIONS     1. Left ventricular ejection fraction, by estimation, is 55 to 60%. The  left ventricle has normal function. The left ventricle has no regional  wall motion abnormalities. Left  ventricular diastolic parameters are  consistent with Grade I diastolic  dysfunction (impaired relaxation).   2. Right ventricular systolic function is normal. The right ventricular  size is normal. Tricuspid regurgitation signal is inadequate for assessing  PA pressure.   3. The mitral valve is normal in structure. Trivial mitral valve  regurgitation. No evidence of mitral stenosis.   4. The aortic valve is tricuspid. Aortic valve regurgitation is trivial.  No aortic stenosis is present.   5. The inferior vena cava is normal in size with greater than 50%  respiratory variability, suggesting right atrial pressure of 3 mmHg.   Cardiac catheterization 09/06/2021   Mid Cx to Dist Cx lesion is 99% stenosed with 99% stenosed side branch in LPAV.  Balloon angioplasty was performed on the sidebranch, using a BALLN SAPPHIRE 2.0X12.   Post intervention, the side branch was reduced to 20% residual stenosis -> following stent placement and main branch..   A drug-eluting stent was successfully placed in the main branch across the LPA V sidebranch - using a SYNERGY XD 2.50X16 -> deployed/postdilated at high ATM with stent balloon to 2.8 mm.   Post intervention, there is a 0% residual stenosis.   LV end diastolic pressure is low.   SUMMARY Severe bifurcation disease involving Distal LCx-OM2 and AV groove LCx 99% stenosis ->  Successful bifurcation PCI with DES of mid to distal LCx-OM2 (Synergy DES 2.5 mm x 18 mm -postdilated 2.8 mm) and  PTCA of AV groove LCx sidebranch (2.0 mm balloon) -> with only minimal 20% residual stenosis after jailing with stent. Otherwise normal coronary arteries with small Ramus Intermedius Initially borderline hypotension which resolved after 250 mL bolus.  LVEDP was only 6 mmHg.     RECOMMENDATIONS Patient has admission orders for overnight monitoring post PCI given NSTEMI. Continue aggressive risk factor modification based on GUIDELINE DIRECTED MEDICAL THERAPY =>  high-dose statin, beta-blocker, ACE/ARB 2D echo pending       Bryan Lemma, MD   Diagnostic Dominance: Right Intervention      ASSESSMENT AND PLAN:  1.  CAD with NSTEMI in January 2023. S/p DES x1 to left circumflex  Continue aspirin, metoprolol, statin.  Lifestyle modification Regular aerobic activity   2. Hyperlipidemia-on maximally tolerated statin and Zetia. Still having arthralgias. LDL not at goal < 55. Recommend Repatha. Will arrange visit with Pharm D. She is resistant to getting a needle but I insist.    3. Tobacco abuse-failed cessation on Wellbutrin, Chantix, and patches.  Smoking cessation recommended   4. Elevated BP- BP borderline on ARB and Toprol. Monitor at home  5. Obesity. Insurance unlikely to cover GLP 1 agonist and she is resistant to taking shot anyway.   Current medicines are reviewed at length with the patient today.  The patient does not have concerns regarding medicines.  The following changes have been made:  no change  Labs/ tests ordered today include:   Orders Placed This Encounter  Procedures   EKG 12-Lead         Disposition:   FU with me in 6 months  Signed, Sanvi Ehler [place], MD  11/01/2023 11:38 AM    [facility] [address] [place], [place], [address] Phone [phone], Fax [phone]

## 2023-11-01 ENCOUNTER — Ambulatory Visit: Payer: 59 | Attending: Cardiology | Admitting: Cardiology

## 2023-11-01 ENCOUNTER — Encounter: Payer: Self-pay | Admitting: Cardiology

## 2023-11-01 VITALS — BP 140/80 | HR 88 | Ht 63.0 in | Wt 212.0 lb

## 2023-11-01 DIAGNOSIS — I10 Essential (primary) hypertension: Secondary | ICD-10-CM | POA: Diagnosis not present

## 2023-11-01 DIAGNOSIS — E7800 Pure hypercholesterolemia, unspecified: Secondary | ICD-10-CM

## 2023-11-01 DIAGNOSIS — I509 Heart failure, unspecified: Secondary | ICD-10-CM

## 2023-11-01 NOTE — Patient Instructions (Signed)
 Medication Instructions:  Continue same medications *If you need a refill on your cardiac medications before your next appointment, please call your pharmacy*   Lab Work: None ordered   Testing/Procedures: None ordered   Follow-Up: At [facility], you and your health needs are our priority.  As part of our continuing mission to provide you with exceptional heart care, we have created designated Provider Care Teams.  These Care Teams include your primary Cardiologist (physician) and Advanced Practice Providers (APPs -  Physician Assistants and Nurse Practitioners) who all work together to provide you with the care you need, when you need it.  We recommend signing up for the patient portal called "MyChart".  Sign up information is provided on this After Visit Summary.  MyChart is used to connect with patients for Virtual Visits (Telemedicine).  Patients are able to view lab/test results, encounter notes, upcoming appointments, etc.  Non-urgent messages can be sent to your provider as well.   To learn more about what you can do with MyChart, go to [email or web address].    Your next appointment:  6 months   Call in June to schedule Sept appointment     Provider:  Dr.Jordan       Schedule appointment with Pharm D in Lipid Clinic

## 2023-12-24 ENCOUNTER — Other Ambulatory Visit: Payer: Self-pay | Admitting: Cardiology

## 2023-12-31 NOTE — Progress Notes (Unsigned)
 Office Visit    Patient Name: Miranda Little Date of Encounter: 01/01/2024  Primary Care Provider:  Pcp, No Primary Cardiologist:  Peter Swazil[place], MD  Chief Complaint    Hyperlipidemia   Significant Past Medical History   CAD 1/23 NSTEMI, DES to Cx  HTN On losartan , metoprolol   Tobacco abuse < 1 ppd; Could not tolerate Chantix  (panic attacks, nightmares);      Allergies  Allergen Reactions   Darvocet [Propoxyphene N-Acetaminophen ] Hives   Sulfa Antibiotics     History of Present Illness    Miranda Little is a 68 y.o. female patient of Dr Swaziland, in the office today to discuss options for cholesterol management.  She is hesitant to be here as he told her the medication would be injectable, and she has a fear of needles.    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, structural and Consulting civil engineer (primary), [employer] Employees (secondary)  Pharmacy:   [facility] [place]  Healthwell:  primary is commercial plan    LDL Cholesterol goal:  LDL < 55  Current Medications:   rosuvastatin  40 mg every day, ezetimibe  10 mg qd  Previously tried:  rosuvastatin  - myalgias, on Tylenol  arthritis  Family Hx: mother had TB at 17,returned in 22's; father had stroke, colon cancer; 2 sibling deceased, neither with heart issues, child 21 in good health    Diet:  trying to cut back on fast foods, doesn't handle grease well (no gall bladder), doesn't agree with red meat;  more pork and fish; regular vegetables, tries to avoid canned if possible. Some form of dessert/sweet every day (one Oreo/dove chocolate, often fruit ; occasional Coke (will drink 20 oz bottle over 1-2 days)  Exercise: occasionally walks, [place], active [place], just planted her summer garden   Accessory Clinical Findings   Lab Results  Component Value Date   CHOL 143 08/28/2023   HDL 44 08/28/2023   LDLCALC 81 08/28/2023   TRIG 94 08/28/2023   CHOLHDL 3.3 08/28/2023    No results found for: "LIPOA"  Lab Results  Component Value  Date   ALT 28 08/28/2023   AST 33 08/28/2023   ALKPHOS 104 08/28/2023   BILITOT 0.5 08/28/2023   Lab Results  Component Value Date   CREATININE 0.81 06/11/2023   BUN 12 06/11/2023   NA 141 06/11/2023   K 4.6 06/11/2023   CL 102 06/11/2023   CO2 23 06/11/2023   Lab Results  Component Value Date   HGBA1C 5.5 09/06/2021    Home Medications    Current Outpatient Medications  Medication Sig Dispense Refill   ASPIRIN  LOW DOSE 81 MG tablet TAKE ONE TABLET BY MOUTH DAILY. swallow whole. 90 tablet 3   ezetimibe  (ZETIA ) 10 MG tablet Take 1 tablet (10 mg total) by mouth daily. 90 tablet 3   losartan  (COZAAR ) 50 MG tablet Take 1 tablet (50 mg total) by mouth daily. 90 tablet 3   metoprolol  succinate (TOPROL -XL) 25 MG 24 hr tablet TAKE ONE TABLET BY MOUTH EVERY DAY 90 tablet 2   rosuvastatin  (CRESTOR ) 40 MG tablet Take 1/2 tablet ( 20 mg ) daily     acetaminophen  (TYLENOL ) 325 MG tablet Take 650 mg by mouth every 6 (six) hours as needed for mild pain.     nitroGLYCERIN  (NITROSTAT ) 0.4 MG SL tablet Place 1 tablet (0.4 mg total) under the tongue every 5 (five) minutes x 3 doses as needed for chest pain. 25 tablet 4   No current facility-administered  medications for this visit.     Assessment & Plan    Hypercholesterolemia Assessment: Patient with ASCVD not at LDL goal of < 55 Most recent LDL 81 on 08/28/23 Has been compliant with intensity statin/ezetimibe  : taking rosuvastatin  daily, but notes that she hurts so bad she has to take tylenol  to help with pain and so she can sleep Not able to tolerate statins secondary to myalgias  Reviewed options for lowering LDL cholesterol, including PCSK-9 inhibitors, bempedoic acid and inclisiran.  Discussed mechanisms of action, dosing, side effects, potential decreases in LDL cholesterol and costs.  Also reviewed potential options for patient assistance.  Plan: Patient agreeable to starting Repatha 140 mg q14d Decrease rosuvastatin  to 20 mg twice  weekly until starts Repatha, then discontinue Repeat labs after:  3 months Lipid Liver function    Donivan Furry, PharmD CPP [facility] [address]  [place], [place] [address] [phone]  01/01/2024, 11:32 AM

## 2024-01-01 ENCOUNTER — Encounter: Payer: Self-pay | Admitting: Pharmacist Clinician (PhC)/ Clinical Pharmacy Specialist

## 2024-01-01 ENCOUNTER — Telehealth: Payer: Self-pay | Admitting: Pharmacy Technician

## 2024-01-01 ENCOUNTER — Telehealth: Payer: Self-pay | Admitting: Pharmacist Clinician (PhC)/ Clinical Pharmacy Specialist

## 2024-01-01 ENCOUNTER — Ambulatory Visit: Attending: Cardiology | Admitting: Pharmacist Clinician (PhC)/ Clinical Pharmacy Specialist

## 2024-01-01 ENCOUNTER — Other Ambulatory Visit (HOSPITAL_COMMUNITY): Payer: Self-pay

## 2024-01-01 DIAGNOSIS — E7800 Pure hypercholesterolemia, unspecified: Secondary | ICD-10-CM

## 2024-01-01 NOTE — Telephone Encounter (Signed)
     Approved but it would not go. I tried overrides on the [employer] to see if it can be billed as primary and it rejected with all the codes.

## 2024-01-01 NOTE — Patient Instructions (Addendum)
 Your Results:             Your most recent labs Goal  Total Cholesterol 143 < 200  Triglycerides 94 < 150  HDL (happy/good cholesterol) 44 > 40  LDL (lousy/bad cholesterol 81 < 55   Medication changes:  Decrease Crestor  (1/2 tabs) to twice weekly (Mondays and Fridays)  We will start the process to get Repatha covered by your insurance.  Once the prior authorization is complete, I will call/send a MyChart message to let you know and confirm pharmacy information.   You will take one injection every 14 days.  When we get the Repatha approved, you can stop the rosuvastatin  (Crestor ).  Continue with ezetimibe  10 mg daily  Lab orders:  We want to repeat labs after 2-3 months.  We will send you a lab order to remind you once we get closer to that time.     Thank you for choosing [facility]

## 2024-01-01 NOTE — Telephone Encounter (Signed)
 Ran test claim for repatha. For a 28 day supply and the co-pay is 75.00 . PA is not needed at this time under [employer]. This test claim was processed through [facility]- copay amounts may vary at other pharmacies due to pharmacy/plan contracts, or as the patient moves through the different stages of their insurance plan.     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 [employer] does require a prior authorization. I sent a pa request through humana.    Pharmacy Patient Advocate Encounter   Received notification from Pt Calls Messages that prior authorization for repatha is required/requested.   Insurance verification completed.   The patient is insured through [employer] .   Per test claim: PA required; PA submitted to above mentioned insurance via CoverMyMeds Key/confirmation #/EOC [id number] Status is pending

## 2024-01-01 NOTE — Telephone Encounter (Signed)
 Please do PA for Repatha

## 2024-01-01 NOTE — Assessment & Plan Note (Signed)
 Assessment: Patient with ASCVD not at LDL goal of < 55 Most recent LDL 81 on 08/28/23 Has been compliant with intensity statin/ezetimibe  : taking rosuvastatin  daily, but notes that she hurts so bad she has to take tylenol  to help with pain and so she can sleep Not able to tolerate statins secondary to myalgias  Reviewed options for lowering LDL cholesterol, including PCSK-9 inhibitors, bempedoic acid and inclisiran.  Discussed mechanisms of action, dosing, side effects, potential decreases in LDL cholesterol and costs.  Also reviewed potential options for patient assistance.  Plan: Patient agreeable to starting Repatha 140 mg q14d Decrease rosuvastatin  to 20 mg twice weekly until starts Repatha, then discontinue Repeat labs after:  3 months Lipid Liver function

## 2024-01-02 NOTE — Addendum Note (Signed)
 Addended by: Carsynn Bethune L on: 01/02/2024 01:02 PM   Modules accepted: Orders

## 2024-01-14 MED ORDER — REPATHA SURECLICK 140 MG/ML ~~LOC~~ SOAJ: 140.0000 mg | SUBCUTANEOUS | 3 refills | Status: AC

## 2024-01-14 NOTE — Addendum Note (Signed)
 Addended by: Ehtan Delfavero L on: 01/14/2024 10:41 AM   Modules accepted: Orders

## 2024-02-04 ENCOUNTER — Other Ambulatory Visit: Payer: Self-pay | Admitting: Cardiology

## 2024-03-10 ENCOUNTER — Encounter: Payer: Self-pay | Admitting: Cardiology

## 2024-03-11 ENCOUNTER — Other Ambulatory Visit (HOSPITAL_COMMUNITY): Payer: Self-pay | Admitting: Medical

## 2024-03-11 ENCOUNTER — Ambulatory Visit (HOSPITAL_COMMUNITY)
Admission: RE | Admit: 2024-03-11 | Discharge: 2024-03-11 | Disposition: A | Discharge status: Home / Self Care | Source: Ambulatory Visit | Attending: Vascular Surgery | Admitting: Vascular Surgery

## 2024-03-11 DIAGNOSIS — M79605 Pain in left leg: Secondary | ICD-10-CM

## 2024-03-11 DIAGNOSIS — M7989 Other specified soft tissue disorders: Secondary | ICD-10-CM | POA: Diagnosis present

## 2024-03-22 ENCOUNTER — Other Ambulatory Visit: Payer: Self-pay | Admitting: Cardiology

## 2024-04-21 NOTE — Progress Notes (Signed)
 Cardiology Office Note   Date:  04/28/2024   ID:  Charity, Tessier [birth year], MRN [id number]  PCP:  Chrystal Lamarr RAMAN, MD  Cardiologist:   Amery Vandenbos Sw[place], MD   Chief Complaint  Patient presents with   Coronary Artery Disease      History of Present Illness: Miranda Little is a 68 y.o. female who presents for follow up HTN, hyperlipidemia, tobacco abuse, and coronary artery disease.   She presented in Jan 2023 with NSTEMI.   She underwent cardiac catheterization 09/06/2021.  She was noted to have mid-distal circumflex lesion 99% stenosed with a 99% stenosed branch in the L PDA V.  She received PCI with DES x1 to her circumflex. Her echocardiogram 09/07/2021 showed an LVEF of 55-60%, G1 DD, trivial mitral valve regurgitation and no other significant valvular abnormalities.  On follow up today she is doing well from a CV standpoint. No chest pain. Unfortunately continues to smoke. Has tried Chantix , Wellbutrin and multiple nicotine  products. Now on Repatha  due to statin myalgias. Was treated for a Baker's cyst with steroid taper.     Past Medical History:  Diagnosis Date   CAD (coronary artery disease)    Chest pain    Chest pressure    Elevated troponin    Hyperlipidemia    Interstitial edema    Left arm numbness    Tobacco abuse     Past Surgical History:  Procedure Laterality Date   CARPAL TUNNEL RELEASE     CESAREAN SECTION     CHOLECYSTECTOMY N/A 03/18/2015   Procedure: LAPAROSCOPIC CHOLECYSTECTOMY WITH INTRAOPERATIVE CHOLANGIOGRAM;  Surgeon: Debby Shipper, MD;  Location: [facility] OR;  Service: General;  Laterality: N/A;   CYST REMOVAL NECK     ERCP N/A 03/19/2015   Procedure: ENDOSCOPIC RETROGRADE CHOLANGIOPANCREATOGRAPHY (ERCP);  Surgeon: Oliva Boots, MD;  Location: [facility] ENDOSCOPY;  Service: Endoscopy;  Laterality: N/A;   KNEE SURGERY Right    LEFT HEART CATH AND CORONARY ANGIOGRAPHY N/A 09/06/2021   Procedure: LEFT HEART CATH AND CORONARY ANGIOGRAPHY;  Surgeon:  Anner Alm LELON, MD;  Location: [facility] INVASIVE CV LAB;  Service: Cardiovascular;  Laterality: N/A;     Current Outpatient Medications  Medication Sig Dispense Refill   acetaminophen  (TYLENOL ) 325 MG tablet Take 650 mg by mouth every 6 (six) hours as needed for mild pain.     acyclovir ointment (ZOVIRAX) 5 % Apply 1 Application topically.     ALPRAZolam  (XANAX ) 0.5 MG tablet Take 0.5 mg by mouth at bedtime as needed for anxiety (Takes 1/2 tablet when can't sleep).     aspirin  EC (ASPIRIN  LOW DOSE) 81 MG tablet TAKE ONE TABLET BY MOUTH DAILY. swallow whole. 60 tablet 0   Evolocumab  (REPATHA  SURECLICK) 140 MG/ML SOAJ Inject 140 mg into the skin every 14 (fourteen) days. 6 mL 3   ezetimibe  (ZETIA ) 10 MG tablet Take 1 tablet (10 mg total) by mouth daily. 90 tablet 3   losartan  (COZAAR ) 50 MG tablet Take 1 tablet (50 mg total) by mouth daily. 90 tablet 3   metoprolol  succinate (TOPROL -XL) 25 MG 24 hr tablet TAKE ONE TABLET BY MOUTH EVERY DAY 90 tablet 2   nitroGLYCERIN  (NITROSTAT ) 0.4 MG SL tablet Place 1 tablet (0.4 mg total) under the tongue every 5 (five) minutes x 3 doses as needed for chest pain. 25 tablet 4   No current facility-administered medications for this visit.    Allergies:   Codeine, Darvocet [propoxyphene n-acetaminophen ], and Sulfa antibiotics  Social History:  The patient  reports that she has been smoking cigarettes. She has a 17.5 pack-year smoking history. She has never used smokeless tobacco. She reports current alcohol use. She reports that she does not use drugs.   Family History:  The patient's family history includes Cancer in her father; Hypertension in her sister; Stroke in her father; Tuberculosis in her mother.    ROS:  Please see the history of present illness.   Otherwise, review of systems are positive for .   All other systems are reviewed and negative.    PHYSICAL EXAM: VS:  BP 120/66 (BP Location: Right Arm, Cuff Size: Large)   Pulse (!) 104   Ht 5' 3  (1.6 m)   Wt 206 lb 6.4 oz (93.6 kg)   SpO2 95%   BMI 36.56 kg/m  , BMI Body mass index is 36.56 kg/m. GEN: Well nourished, well developed, in no acute distress HEENT: normal Neck: no JVD, carotid bruits, or masses Cardiac: RRR; no murmurs, rubs, or gallops,no edema  Respiratory:  clear to auscultation bilaterally, normal work of breathing GI: soft, nontender, nondistended, + BS MS: no deformity or atrophy Skin: warm and dry, no rash Neuro:  Strength and sensation are intact Psych: euthymic mood, full affect   EKG Interpretation Date/Time:  Monday April 28 2024 14:00:08 EDT Ventricular Rate:  104 PR Interval:  164 QRS Duration:  96 QT Interval:  350 QTC Calculation: 460 R Axis:   -43  Text Interpretation: Sinus tachycardia Left axis deviation Low voltage QRS Incomplete right bundle branch block Cannot rule out Anterior infarct , age undetermined When compared with ECG of 01-Nov-2023 11:12, HR is faster Confirmed by [place], Norvell Caswell [phone]) on 04/28/2024 2:08:49 PM    Recent Labs: 05/24/2023: TSH 3.170 06/11/2023: BUN 12; Creatinine, Ser 0.81; Potassium 4.6; Sodium 141 08/28/2023: ALT 28    Lipid Panel    Component Value Date/Time   CHOL 143 08/28/2023 0952   TRIG 94 08/28/2023 0952   HDL 44 08/28/2023 0952   CHOLHDL 3.3 08/28/2023 0952   CHOLHDL 5.4 09/06/2021 0510   VLDL 15 09/06/2021 0510   LDLCALC 81 08/28/2023 0952   EKG Interpretation Date/Time:  Monday April 28 2024 14:00:08 EDT Ventricular Rate:  104 PR Interval:  164 QRS Duration:  96 QT Interval:  350 QTC Calculation: 460 R Axis:   -43  Text Interpretation: Sinus tachycardia Left axis deviation Low voltage QRS Incomplete right bundle branch block Cannot rule out Anterior infarct , age undetermined When compared with ECG of 01-Nov-2023 11:12, HR is faster Confirmed by [place], Annette Bertelson [phone]) on 04/28/2024 2:08:49 PM    Wt Readings from Last 3 Encounters:  04/28/24 206 lb 6.4 oz (93.6 kg)  11/01/23  212 lb (96.2 kg)  05/04/23 213 lb (96.6 kg)    EKG Interpretation Date/Time:  Monday April 28 2024 14:00:08 EDT Ventricular Rate:  104 PR Interval:  164 QRS Duration:  96 QT Interval:  350 QTC Calculation: 460 R Axis:   -43  Text Interpretation: Sinus tachycardia Left axis deviation Low voltage QRS Incomplete right bundle branch block Cannot rule out Anterior infarct , age undetermined When compared with ECG of 01-Nov-2023 11:12, HR is faster Confirmed by [place], Charlsey Moragne [phone]) on 04/28/2024 2:08:49 PM    Other studies Reviewed: Additional studies/ records that were reviewed today include:   Echocardiogram 09/07/2021 IMPRESSIONS     1. Left ventricular ejection fraction, by estimation, is 55 to 60%. The  left ventricle has normal  function. The left ventricle has no regional  wall motion abnormalities. Left ventricular diastolic parameters are  consistent with Grade I diastolic  dysfunction (impaired relaxation).   2. Right ventricular systolic function is normal. The right ventricular  size is normal. Tricuspid regurgitation signal is inadequate for assessing  PA pressure.   3. The mitral valve is normal in structure. Trivial mitral valve  regurgitation. No evidence of mitral stenosis.   4. The aortic valve is tricuspid. Aortic valve regurgitation is trivial.  No aortic stenosis is present.   5. The inferior vena cava is normal in size with greater than 50%  respiratory variability, suggesting right atrial pressure of 3 mmHg.   Cardiac catheterization 09/06/2021   Mid Cx to Dist Cx lesion is 99% stenosed with 99% stenosed side branch in LPAV.   Balloon angioplasty was performed on the sidebranch, using a BALLN SAPPHIRE 2.0X12.   Post intervention, the side branch was reduced to 20% residual stenosis -> following stent placement and main branch..   A drug-eluting stent was successfully placed in the main branch across the LPA V sidebranch - using a SYNERGY XD 2.50X16 ->  deployed/postdilated at high ATM with stent balloon to 2.8 mm.   Post intervention, there is a 0% residual stenosis.   LV end diastolic pressure is low.   SUMMARY Severe bifurcation disease involving Distal LCx-OM2 and AV groove LCx 99% stenosis ->  Successful bifurcation PCI with DES of mid to distal LCx-OM2 (Synergy DES 2.5 mm x 18 mm -postdilated 2.8 mm) and  PTCA of AV groove LCx sidebranch (2.0 mm balloon) -> with only minimal 20% residual stenosis after jailing with stent. Otherwise normal coronary arteries with small Ramus Intermedius Initially borderline hypotension which resolved after 250 mL bolus.  LVEDP was only 6 mmHg.     RECOMMENDATIONS Patient has admission orders for overnight monitoring post PCI given NSTEMI. Continue aggressive risk factor modification based on GUIDELINE DIRECTED MEDICAL THERAPY => high-dose statin, beta-blocker, ACE/ARB 2D echo pending       Alm Clay, MD   Diagnostic Dominance: Right Intervention      ASSESSMENT AND PLAN:  1.  CAD with NSTEMI in January 2023. S/p DES x1 to left circumflex  Continue aspirin , metoprolol .  Lifestyle modification Regular aerobic activity   2. Hyperlipidemia-on Zetia . Statin induced arthralgias. Now on  Repatha . Follow up labs today   3. Tobacco abuse-failed cessation on Wellbutrin, Chantix , and patches.  Smoking cessation recommended   4. Elevated BP- BP at goal   5. Obesity. Encourage weight loss   Current medicines are reviewed at length with the patient today.  The patient does not have concerns regarding medicines.  The following changes have been made:  no change  Labs/ tests ordered today include:   Orders Placed This Encounter  Procedures   EKG 12-Lead         Disposition:   FU with me in 6 months  Signed, Tillmon Kisling [place], MD  04/28/2024 2:27 PM    [facility] [address], [place], [place], [address] Phone [phone], Fax [phone]

## 2024-04-28 ENCOUNTER — Other Ambulatory Visit: Payer: Self-pay

## 2024-04-28 ENCOUNTER — Encounter: Payer: Self-pay | Admitting: Cardiology

## 2024-04-28 ENCOUNTER — Ambulatory Visit: Attending: Cardiology | Admitting: Cardiology

## 2024-04-28 VITALS — BP 120/66 | HR 104 | Ht 63.0 in | Wt 206.4 lb

## 2024-04-28 DIAGNOSIS — I10 Essential (primary) hypertension: Secondary | ICD-10-CM | POA: Diagnosis not present

## 2024-04-28 DIAGNOSIS — R000 Tachycardia, unspecified: Secondary | ICD-10-CM | POA: Diagnosis not present

## 2024-04-28 DIAGNOSIS — M791 Myalgia, unspecified site: Secondary | ICD-10-CM | POA: Diagnosis not present

## 2024-04-28 DIAGNOSIS — I25110 Atherosclerotic heart disease of native coronary artery with unstable angina pectoris: Secondary | ICD-10-CM | POA: Diagnosis not present

## 2024-04-28 DIAGNOSIS — T466X5D Adverse effect of antihyperlipidemic and antiarteriosclerotic drugs, subsequent encounter: Secondary | ICD-10-CM

## 2024-04-28 DIAGNOSIS — E7800 Pure hypercholesterolemia, unspecified: Secondary | ICD-10-CM

## 2024-04-28 DIAGNOSIS — E782 Mixed hyperlipidemia: Secondary | ICD-10-CM

## 2024-04-28 MED ORDER — NITROGLYCERIN 0.4 MG SL SUBL: 0.4000 mg | SUBLINGUAL_TABLET | SUBLINGUAL | 4 refills | Status: AC | PRN

## 2024-04-28 NOTE — Patient Instructions (Signed)

## 2024-04-29 LAB — LIPID PANEL
Chol/HDL Ratio: 3.1 ratio (ref 0.0–4.4)
Cholesterol, Total: 126 mg/dL (ref 100–199)
HDL: 41 mg/dL (ref >39)
LDL Chol Calc (NIH): 62 mg/dL (ref 0–99)
Triglycerides: 127 mg/dL (ref 0–149)
VLDL Cholesterol Cal: 23 mg/dL (ref 5–40)

## 2024-05-05 ENCOUNTER — Ambulatory Visit: Payer: Self-pay | Admitting: Pharmacist Clinician (PhC)/ Clinical Pharmacy Specialist

## 2024-06-19 ENCOUNTER — Other Ambulatory Visit: Payer: Self-pay | Admitting: Cardiology

## 2024-10-22 ENCOUNTER — Ambulatory Visit: Admitting: Cardiology
